# Patient Record
Sex: Male | Born: 1960 | Race: White | Hispanic: No | Smoking: Never smoker
Health system: Southern US, Community
[De-identification: ages and names within clinical notes are randomized; demographics above are authoritative.]

## PROBLEM LIST (undated history)

## (undated) DIAGNOSIS — F32A Depression, unspecified: Secondary | ICD-10-CM

## (undated) DIAGNOSIS — I519 Heart disease, unspecified: Secondary | ICD-10-CM

## (undated) DIAGNOSIS — G4730 Sleep apnea, unspecified: Secondary | ICD-10-CM

## (undated) DIAGNOSIS — F419 Anxiety disorder, unspecified: Secondary | ICD-10-CM

## (undated) DIAGNOSIS — R011 Cardiac murmur, unspecified: Secondary | ICD-10-CM

## (undated) DIAGNOSIS — K219 Gastro-esophageal reflux disease without esophagitis: Secondary | ICD-10-CM

## (undated) DIAGNOSIS — F329 Major depressive disorder, single episode, unspecified: Secondary | ICD-10-CM

## (undated) DIAGNOSIS — E119 Type 2 diabetes mellitus without complications: Secondary | ICD-10-CM

## (undated) DIAGNOSIS — I499 Cardiac arrhythmia, unspecified: Secondary | ICD-10-CM

## (undated) HISTORY — DX: Depression, unspecified: F32.A

## (undated) HISTORY — DX: Anxiety disorder, unspecified: F41.9

## (undated) HISTORY — DX: Cardiac arrhythmia, unspecified: I49.9

## (undated) HISTORY — DX: Heart disease, unspecified: I51.9

## (undated) HISTORY — PX: OTHER SURGICAL HISTORY: SHX169

## (undated) HISTORY — DX: Major depressive disorder, single episode, unspecified: F32.9

## (undated) HISTORY — DX: Cardiac murmur, unspecified: R01.1

## (undated) HISTORY — DX: Gastro-esophageal reflux disease without esophagitis: K21.9

## (undated) HISTORY — DX: Sleep apnea, unspecified: G47.30

## (undated) HISTORY — DX: Type 2 diabetes mellitus without complications: E11.9

---

## 2006-05-14 ENCOUNTER — Ambulatory Visit: Payer: Self-pay | Admitting: Unknown Physician Specialty

## 2006-06-06 ENCOUNTER — Ambulatory Visit: Payer: Self-pay | Admitting: Unknown Physician Specialty

## 2010-04-01 ENCOUNTER — Ambulatory Visit: Payer: Self-pay | Admitting: Unknown Physician Specialty

## 2010-04-05 ENCOUNTER — Ambulatory Visit: Payer: Self-pay | Admitting: Unknown Physician Specialty

## 2014-08-18 DIAGNOSIS — E109 Type 1 diabetes mellitus without complications: Secondary | ICD-10-CM | POA: Insufficient documentation

## 2015-06-04 DIAGNOSIS — R110 Nausea: Secondary | ICD-10-CM | POA: Insufficient documentation

## 2015-06-04 DIAGNOSIS — R058 Other specified cough: Secondary | ICD-10-CM | POA: Insufficient documentation

## 2015-06-04 DIAGNOSIS — R05 Cough: Secondary | ICD-10-CM | POA: Insufficient documentation

## 2015-09-07 ENCOUNTER — Ambulatory Visit: Payer: Self-pay | Admitting: Urology

## 2015-11-09 ENCOUNTER — Ambulatory Visit: Payer: Self-pay | Admitting: Urology

## 2015-11-23 ENCOUNTER — Encounter: Payer: Self-pay | Admitting: Urology

## 2015-11-23 ENCOUNTER — Ambulatory Visit (INDEPENDENT_AMBULATORY_CARE_PROVIDER_SITE_OTHER): Payer: BC Managed Care – PPO | Admitting: Urology

## 2015-11-23 VITALS — BP 132/77 | HR 67 | Ht 71.0 in | Wt 173.0 lb

## 2015-11-23 DIAGNOSIS — N5203 Combined arterial insufficiency and corporo-venous occlusive erectile dysfunction: Secondary | ICD-10-CM

## 2015-11-23 DIAGNOSIS — Z8739 Personal history of other diseases of the musculoskeletal system and connective tissue: Secondary | ICD-10-CM | POA: Insufficient documentation

## 2015-11-23 DIAGNOSIS — Z125 Encounter for screening for malignant neoplasm of prostate: Secondary | ICD-10-CM

## 2015-11-23 DIAGNOSIS — R350 Frequency of micturition: Secondary | ICD-10-CM | POA: Diagnosis not present

## 2015-11-23 DIAGNOSIS — B360 Pityriasis versicolor: Secondary | ICD-10-CM | POA: Insufficient documentation

## 2015-11-23 LAB — URINALYSIS, COMPLETE
Bilirubin, UA: NEGATIVE
Ketones, UA: NEGATIVE
Leukocytes, UA: NEGATIVE
NITRITE UA: NEGATIVE
Protein, UA: NEGATIVE
RBC, UA: NEGATIVE
Specific Gravity, UA: 1.015 (ref 1.005–1.030)
Urobilinogen, Ur: 0.2 mg/dL (ref 0.2–1.0)
pH, UA: 7 (ref 5.0–7.5)

## 2015-11-23 LAB — BLADDER SCAN AMB NON-IMAGING

## 2015-11-23 LAB — MICROSCOPIC EXAMINATION
BACTERIA UA: NONE SEEN
EPITHELIAL CELLS (NON RENAL): NONE SEEN /HPF (ref 0–10)
RBC MICROSCOPIC, UA: NONE SEEN /HPF (ref 0–?)
WBC UA: NONE SEEN /HPF (ref 0–?)

## 2015-11-23 MED ORDER — OXYBUTYNIN CHLORIDE ER 10 MG PO TB24: 10.0000 mg | ORAL_TABLET | Freq: Every day | ORAL | Status: DC

## 2015-11-23 MED ORDER — SILDENAFIL CITRATE 100 MG PO TABS
100.0000 mg | ORAL_TABLET | Freq: Every day | ORAL | Status: DC | PRN
Start: 2015-11-23 — End: 2019-06-16

## 2015-11-23 NOTE — Progress Notes (Signed)
11/23/2015 5:28 PM   Travis Bishop 05/04/1961 [id number]  Referring provider: Beatris Si Klein/ [place]  Chief Complaint  Patient presents with  . Nocturia    New Patient    HPI: 55 yo M with urinary frequency and nocturia x2 which is fairly new over the past few months.  No dysuria or gross hematuria.  He does start with a slow urinary stream which improves with bladder emptying.  He does feel that he is able to empty this bladder for the most part.  Sometimes, he will void a small amount ~30 min later after he empties.    He has a history of type I diabetes since age 40.  His A1c 8 but has been poorly controlled as high as 13 many years ago.  No recent PSAs/ DRE.  No family history of prostate cancer.  His father did have bladder cancer.    He does complain of ED with mainating erections.  No ejaculatory dysfunction or orgasm.  This has been happening for the past 1.5 years.  He has tried Viagra for this problem in the past which as worked.  No contraindications.           IPSS      11/23/15 1400       International Prostate Symptom Score   How often have you had the sensation of not emptying your bladder? Less than half the time     How often have you had to urinate less than every two hours? More than half the time     How often have you found you stopped and started again several times when you urinated? Less than half the time     How often have you found it difficult to postpone urination? Less than half the time     How often have you had a weak urinary stream? Almost always     How often have you had to strain to start urination? More than half the time     How many times did you typically get up at night to urinate? 2 Times     Total IPSS Score 21     Quality of Life due to urinary symptoms   If you were to spend the rest of your life with your urinary condition just the way it is now how would you feel about that? Mostly Disatisfied        Score:  1-7 Mild 8-19  Moderate 20-35 Severe    PMH: Past Medical History  Diagnosis Date  . GERD (gastroesophageal reflux disease)   . Anxiety   . Arrhythmia   . Asthma   . Depression   . Diabetes ([place])   . Heart disease   . Heart murmur   . Sleep apnea     Surgical History: Past Surgical History  Procedure Laterality Date  . None      Home Medications:    Medication List       This list is accurate as of: 11/23/15 11:59 PM.  Always use your most recent med list.               busPIRone 15 MG tablet  Commonly known as:  BUSPAR     NOVOLOG 100 UNIT/ML injection  Generic drug:  insulin aspart  U UTD VIA INSULIN PUMP.  DO NOT EXCEED 100 UNITS PER DAY.     oxybutynin 10 MG 24 hr tablet  Commonly known as:  DITROPAN XL  Take 1  tablet (10 mg total) by mouth at bedtime.     sertraline 50 MG tablet  Commonly known as:  ZOLOFT     sildenafil 100 MG tablet  Commonly known as:  VIAGRA  Take 1 tablet (100 mg total) by mouth daily as needed for erectile dysfunction.        Allergies: No Known Allergies  Family History: Family History  Problem Relation Age of Onset  . Bladder Cancer Father   . Prostate cancer Neg Hx   . Kidney cancer Neg Hx     Social History:  reports that he has never smoked. He does not have any smokeless tobacco history on file. He reports that he drinks alcohol. He reports that he does not use illicit drugs.  ROS: UROLOGY Frequent Urination?: Yes Hard to postpone urination?: No Burning/pain with urination?: No Get up at night to urinate?: Yes Leakage of urine?: Yes Urine stream starts and stops?: Yes Trouble starting stream?: Yes Do you have to strain to urinate?: Yes Blood in urine?: No Urinary tract infection?: No Sexually transmitted disease?: No Injury to kidneys or bladder?: No Painful intercourse?: No Weak stream?: Yes Erection problems?: Yes Penile pain?: No  Gastrointestinal Nausea?: No Vomiting?: No Indigestion/heartburn?:  Yes Diarrhea?: No Constipation?: No  Constitutional Fever: No Night sweats?: No Weight loss?: No Fatigue?: No  Skin Skin rash/lesions?: Yes Itching?: Yes  Eyes Blurred vision?: No Double vision?: No  Ears/Nose/Throat Sore throat?: No Sinus problems?: No  Hematologic/Lymphatic Swollen glands?: No Easy bruising?: No  Cardiovascular Leg swelling?: No Chest pain?: No  Respiratory Cough?: No Shortness of breath?: No  Endocrine Excessive thirst?: No  Musculoskeletal Back pain?: No Joint pain?: No  Neurological Headaches?: No Dizziness?: No  Psychologic Depression?: Yes Anxiety?: Yes  Physical Exam: BP 132/77 mmHg  Pulse 67  Ht '5\' 11"'$  (1.803 m)  Wt 173 lb (78.472 kg)  BMI 24.14 kg/m2  Constitutional:  Alert and oriented, No acute distress. HEENT: [place] AT, moist mucus membranes.  Trachea midline, no masses. Cardiovascular: No clubbing, cyanosis, or edema. Respiratory: Normal respiratory effort, no increased work of breathing. GI: Abdomen is soft, nontender, nondistended, no abdominal masses GU: No CVA tenderness. Circumcised phallus, patient urethral meatus, normal scrotum with descended testicles bilaterally, no masses. DRE: Normal sphincter tone, 30 cc prostate, no nodules, nontender Skin: No rashes, bruises or suspicious lesions. Lymph: No cervical or inguinal adenopathy. Neurologic: Grossly intact, no focal deficits, moving all 4 extremities. Psychiatric: Normal mood and affect.  Laboratory Data:   Basic Metabolic Panel (BMP) (10/08/7251 8:45 AM) Basic Metabolic Panel (BMP) (66/44/0347 8:45 AM)  Component Value Ref Range  Glucose 347 (H) 70 - 110 mg/dL  Sodium 137 136 - 145 mmol/L  Potassium 4.5 3.6 - 5.1 mmol/L  Chloride 99 97 - 109 mmol/L  Carbon Dioxide (CO2) 28.6 22.0 - 32.0 mmol/L  Calcium 9.7 8.7 - 10.3 mg/dL  Urea Nitrogen (BUN) 17 7 - 25 mg/dL  Creatinine 0.9 0.7 - 1.3 mg/dL  Glomerular Filtration Rate (eGFR), MDRD Estimate 87 >60  mL/min/1.73sq m  BUN/Crea Ratio 18.9 6.0 - 20.0  Anion Gap w/K 13.9 6.0 - 42.5   Basic Metabolic Panel (BMP) (95/63/8756 8:45 AM)  Specimen Performing Laboratory  Blood [facility] - LAB  [place], [place] [address]   Back to top of Lab Results   Hemoglobin A1C (07/31/2015 8:45 AM) Hemoglobin A1C (07/31/2015 8:45 AM)  Component Value Ref Range  Hemoglobin A1C 8.5 (H) 4.2 - 5.6 %  Urinalysis Results for orders placed or performed in visit on 11/23/15  Microscopic Examination  Result Value Ref Range   WBC, UA None seen 0 -  5 /hpf   RBC, UA None seen 0 -  2 /hpf   Epithelial Cells (non renal) None seen 0 - 10 /hpf   Bacteria, UA None seen None seen/Few  Urinalysis, Complete  Result Value Ref Range   Specific Gravity, UA 1.015 1.005 - 1.030   pH, UA 7.0 5.0 - 7.5   Color, UA Yellow Yellow   Appearance Ur Clear Clear   Leukocytes, UA Negative Negative   Protein, UA Negative Negative/Trace   Glucose, UA 3+ (A) Negative   Ketones, UA Negative Negative   RBC, UA Negative Negative   Bilirubin, UA Negative Negative   Urobilinogen, Ur 0.2 0.2 - 1.0 mg/dL   Nitrite, UA Negative Negative   Microscopic Examination See below:   PSA  Result Value Ref Range   Prostate Specific Ag, Serum 1.1 0.0 - 4.0 ng/mL  BLADDER SCAN AMB NON-IMAGING  Result Value Ref Range   Scan Result 35m      Pertinent Imaging: Results for orders placed or performed in visit on 11/23/15  Microscopic Examination  Result Value Ref Range   WBC, UA None seen 0 -  5 /hpf   RBC, UA None seen 0 -  2 /hpf   Epithelial Cells (non renal) None seen 0 - 10 /hpf   Bacteria, UA None seen None seen/Few  Urinalysis, Complete  Result Value Ref Range   Specific Gravity, UA 1.015 1.005 - 1.030   pH, UA 7.0 5.0 - 7.5   Color, UA Yellow Yellow   Appearance Ur Clear Clear   Leukocytes, UA Negative Negative   Protein, UA Negative Negative/Trace   Glucose, UA 3+ (A) Negative    Ketones, UA Negative Negative   RBC, UA Negative Negative   Bilirubin, UA Negative Negative   Urobilinogen, Ur 0.2 0.2 - 1.0 mg/dL   Nitrite, UA Negative Negative   Microscopic Examination See below:   PSA  Result Value Ref Range   Prostate Specific Ag, Serum 1.1 0.0 - 4.0 ng/mL  BLADDER SCAN AMB NON-IMAGING  Result Value Ref Range   Scan Result 227m    Assessment & Plan:    1. Urinary frequency Discussed the relationship between long-standing diabetes and overactive bladder. No evidence of incomplete bladder emptying with lobe postvoid residual today.  Small prostate on exam today without obstructive voiding symptoms. Discussed plan for use of anticholinergic to help with bladder overactivity. Common side effects of medication discussed including dry eyes, dry mouth, and constipation. Prescribed oxybutynin 10 mg XL today, will follow-up to discuss efficacy  - Urinalysis, Complete - BLADDER SCAN AMB NON-IMAGING  2. Prostate cancer screening Discussed AUA guidielines vs. USPTF for prostate cancer screening the the risk and benefits of both Shared decision making- elected prostate cancer screening PSA drawn today, rectal exam benign  3. ED Discussed Viagra which was prescribed today   Return in about 3 months (around 02/20/2016) for PVR, symptoms recheck.  AsHollice EspyMD  [facility] [address] [phone]

## 2015-11-24 LAB — PSA: PROSTATE SPECIFIC AG, SERUM: 1.1 ng/mL (ref 0.0–4.0)

## 2016-02-29 ENCOUNTER — Encounter: Payer: Self-pay | Admitting: Urology

## 2016-02-29 ENCOUNTER — Ambulatory Visit: Payer: BC Managed Care – PPO | Admitting: Urology

## 2016-04-23 ENCOUNTER — Ambulatory Visit: Payer: BC Managed Care – PPO | Admitting: Urology

## 2016-07-18 DIAGNOSIS — F411 Generalized anxiety disorder: Secondary | ICD-10-CM | POA: Insufficient documentation

## 2016-07-18 DIAGNOSIS — R1013 Epigastric pain: Secondary | ICD-10-CM | POA: Insufficient documentation

## 2016-12-17 DIAGNOSIS — K469 Unspecified abdominal hernia without obstruction or gangrene: Secondary | ICD-10-CM | POA: Insufficient documentation

## 2016-12-18 ENCOUNTER — Other Ambulatory Visit: Payer: Self-pay | Admitting: Internal Medicine

## 2016-12-18 DIAGNOSIS — K469 Unspecified abdominal hernia without obstruction or gangrene: Secondary | ICD-10-CM

## 2016-12-23 ENCOUNTER — Other Ambulatory Visit
Admission: RE | Admit: 2016-12-23 | Discharge: 2016-12-23 | Disposition: A | Discharge status: Home / Self Care | Payer: BC Managed Care – PPO | Source: Ambulatory Visit | Attending: Internal Medicine | Admitting: Internal Medicine

## 2016-12-23 ENCOUNTER — Ambulatory Visit
Admission: RE | Admit: 2016-12-23 | Discharge: 2016-12-23 | Disposition: A | Discharge status: Home / Self Care | Payer: BC Managed Care – PPO | Source: Ambulatory Visit | Attending: Internal Medicine | Admitting: Internal Medicine

## 2016-12-23 DIAGNOSIS — K469 Unspecified abdominal hernia without obstruction or gangrene: Secondary | ICD-10-CM | POA: Diagnosis present

## 2016-12-23 DIAGNOSIS — K439 Ventral hernia without obstruction or gangrene: Secondary | ICD-10-CM | POA: Diagnosis not present

## 2016-12-23 DIAGNOSIS — N329 Bladder disorder, unspecified: Secondary | ICD-10-CM | POA: Diagnosis not present

## 2016-12-23 LAB — BASIC METABOLIC PANEL
ANION GAP: 6 (ref 5–15)
BUN: 9 mg/dL (ref 6–20)
CO2: 29 mmol/L (ref 22–32)
Calcium: 9.1 mg/dL (ref 8.9–10.3)
Chloride: 101 mmol/L (ref 101–111)
Creatinine, Ser: 0.85 mg/dL (ref 0.61–1.24)
GFR calc Af Amer: >60 mL/min (ref >60)
GFR calc non Af Amer: >60 mL/min (ref >60)
GLUCOSE: 133 mg/dL — AB (ref 65–99)
POTASSIUM: 4.4 mmol/L (ref 3.5–5.1)
Sodium: 136 mmol/L (ref 135–145)

## 2016-12-23 MED ORDER — IOPAMIDOL (ISOVUE-300) INJECTION 61%
100.0000 mL | Freq: Once | INTRAVENOUS | Status: AC | PRN
  Administered 2016-12-23: 100 mL via INTRAVENOUS

## 2017-02-25 ENCOUNTER — Other Ambulatory Visit: Payer: Self-pay | Admitting: Student

## 2017-02-25 DIAGNOSIS — R110 Nausea: Secondary | ICD-10-CM

## 2017-02-25 DIAGNOSIS — K219 Gastro-esophageal reflux disease without esophagitis: Secondary | ICD-10-CM

## 2017-02-27 ENCOUNTER — Ambulatory Visit: Payer: BC Managed Care – PPO

## 2017-03-05 ENCOUNTER — Ambulatory Visit
Admission: RE | Admit: 2017-03-05 | Discharge: 2017-03-05 | Disposition: A | Discharge status: Home / Self Care | Payer: BC Managed Care – PPO | Source: Ambulatory Visit | Attending: Student | Admitting: Student

## 2017-03-05 DIAGNOSIS — R110 Nausea: Secondary | ICD-10-CM | POA: Diagnosis present

## 2017-03-05 DIAGNOSIS — K219 Gastro-esophageal reflux disease without esophagitis: Secondary | ICD-10-CM

## 2017-03-05 DIAGNOSIS — K30 Functional dyspepsia: Secondary | ICD-10-CM | POA: Diagnosis not present

## 2017-12-13 IMAGING — CT CT ABD-PELV W/ CM
2 of 5 series · 16 of 46 positions shown, 18 images · IV contrast (iopamidol)
Comparison: None.

CLINICAL DATA: Abdominal tenderness and swelling for 3 weeks

EXAM:
CT ABDOMEN AND PELVIS WITH CONTRAST
TECHNIQUE: Multidetector CT imaging of the abdomen and pelvis was performed
using the standard protocol following bolus administration of
intravenous contrast. Oral contrast was also administered.
CONTRAST:  100mL [id number] IOPAMIDOL ([id number]) INJECTION 61%

[Series 2: axial soft tissue · axial · 0.77mm/px · z∈[-986,-571]mm · 13 of 93 slices shown, 15 images]
[im 5/93  soft-tissue]
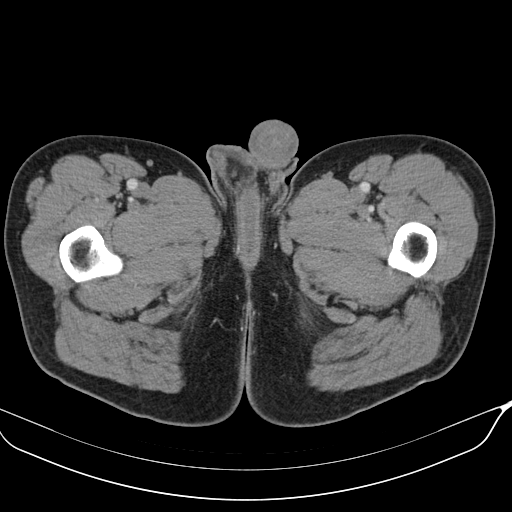
[im 5/93  bone]
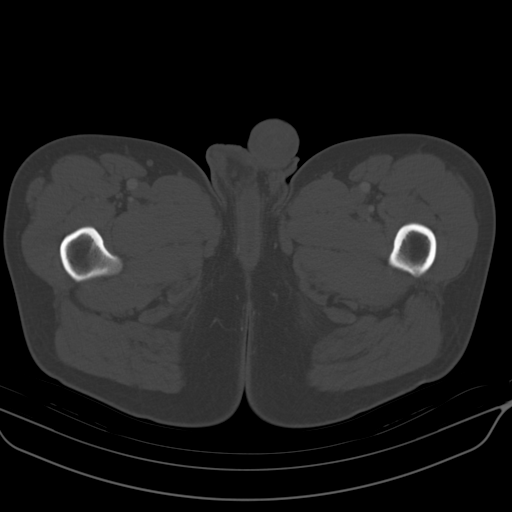
[im 15/93  soft-tissue]
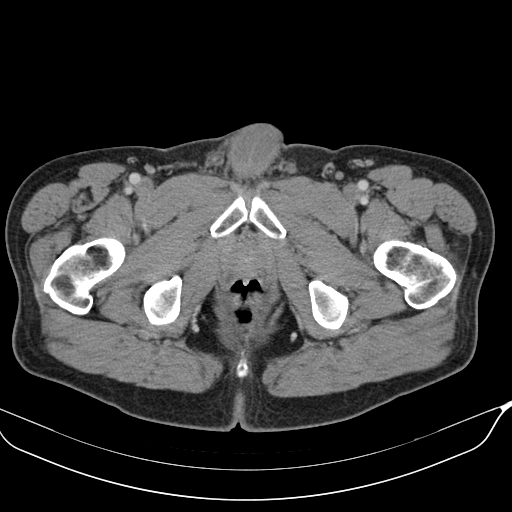
[im 20/93  soft-tissue]
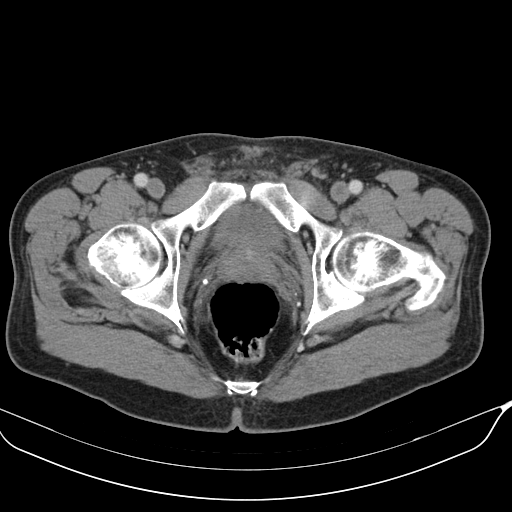
[im 25/93  soft-tissue]
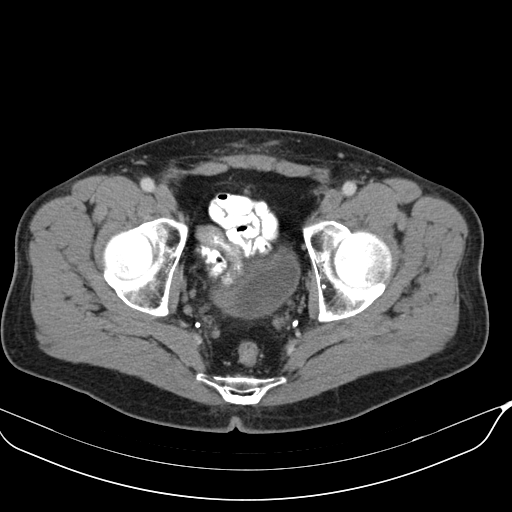
[im 34/93  soft-tissue]
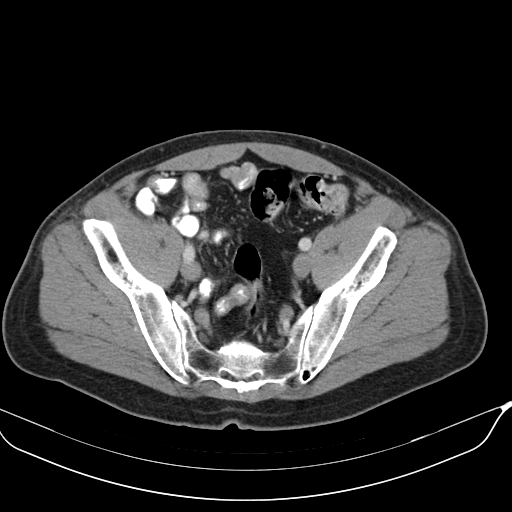
[im 39/93  soft-tissue]
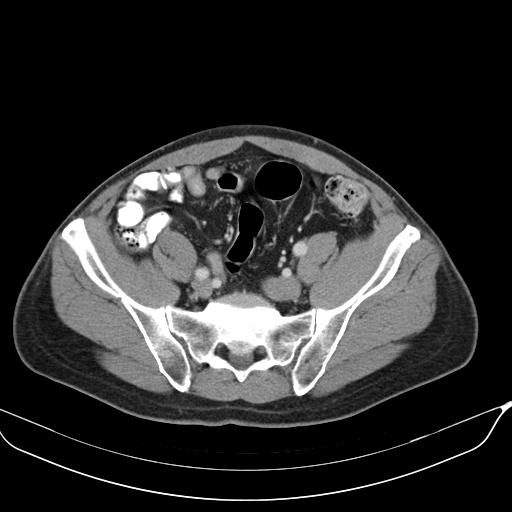
[im 49/93  soft-tissue]
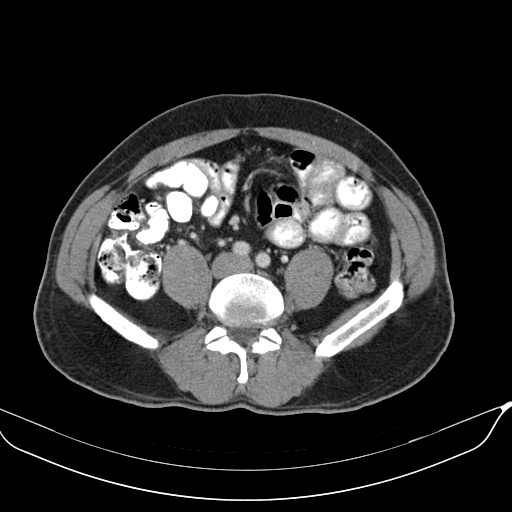
[im 54/93  soft-tissue]
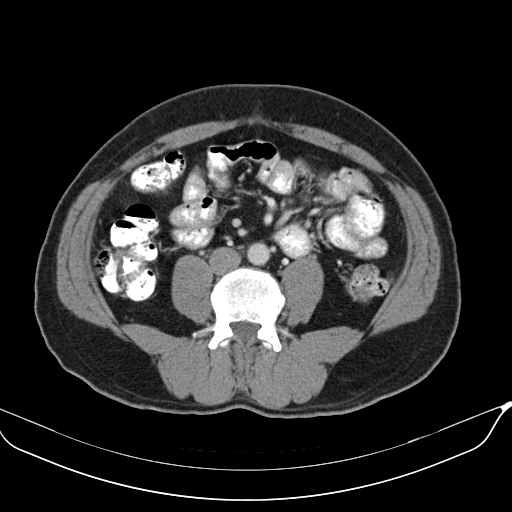
[im 59/93  soft-tissue]
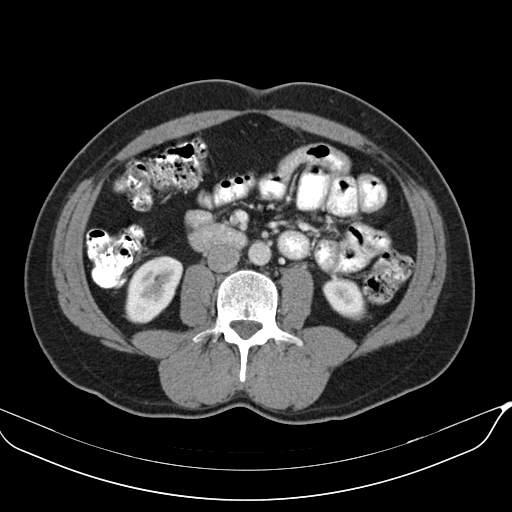
[im 59/93  bone]
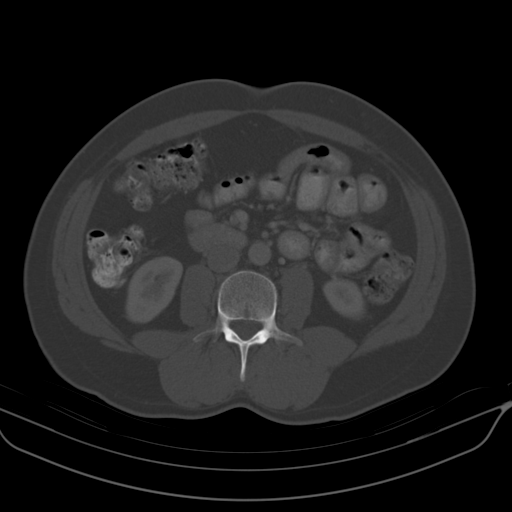
[im 68/93  soft-tissue]
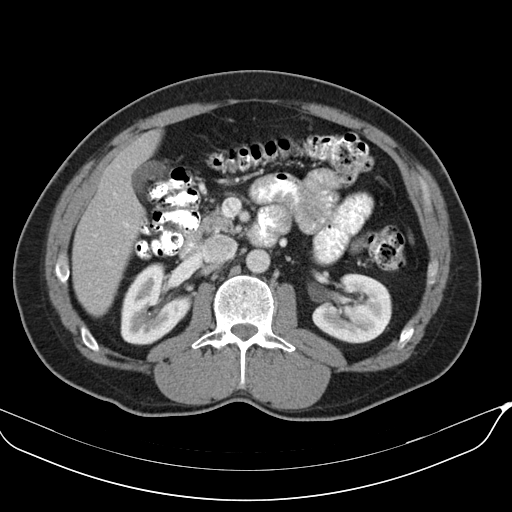
[im 73/93  soft-tissue]
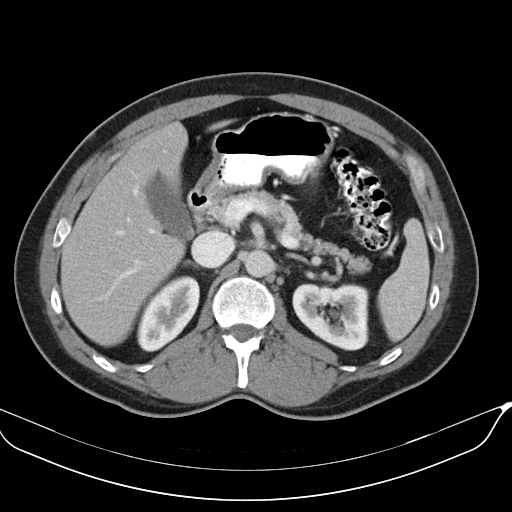
[im 78/93  soft-tissue]
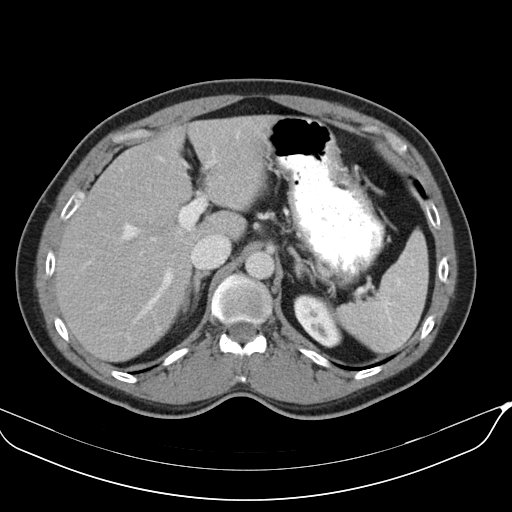
[im 88/93  soft-tissue]
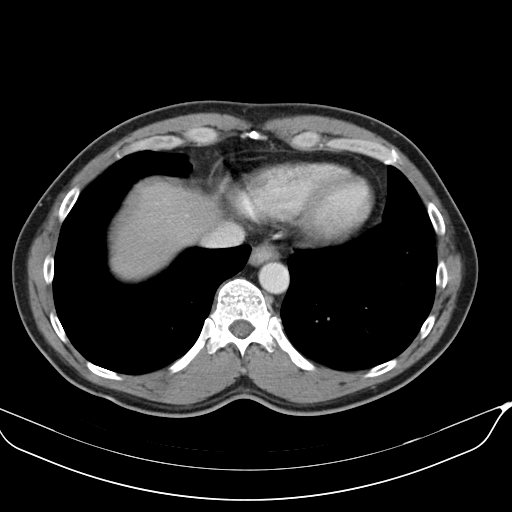

[Series 602: coronal · coronal · 0.90mm/px · 3 of 118 slices shown]
[im 40/118  soft-tissue]
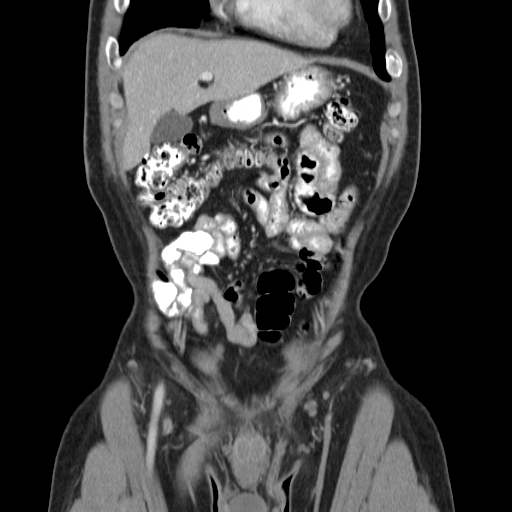
[im 53/118  soft-tissue]
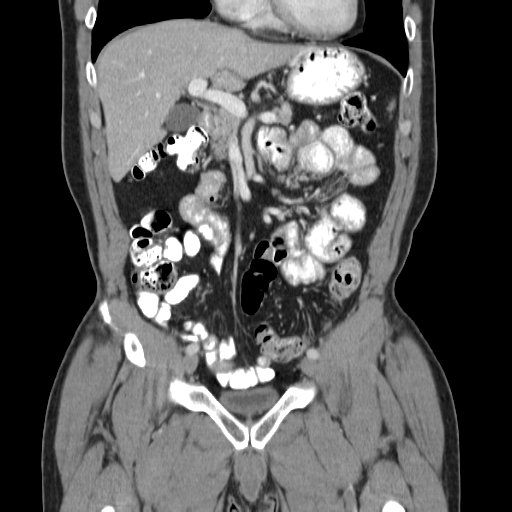
[im 66/118  soft-tissue]
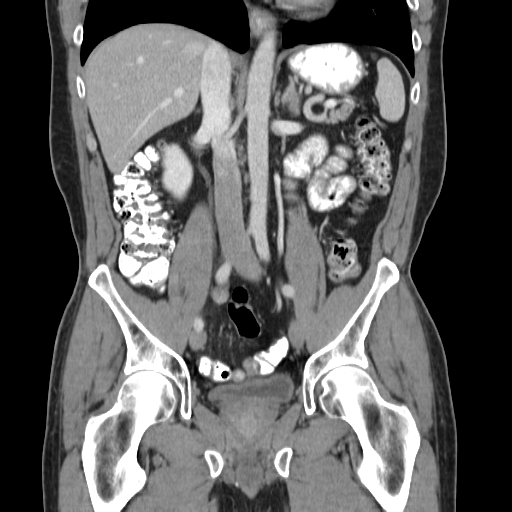

[16 of 46 positions shown; findings below may reference images not displayed]

FINDINGS: Lower chest: Lung bases are clear. Oral contrast is seen in the
distal esophagus.

Hepatobiliary: No focal liver lesions are evident. Gallbladder wall
is not appreciably thickened. There is no biliary duct dilatation.

Pancreas: There is no pancreatic mass or inflammatory focus.

Spleen: No splenic lesions are evident.

Adrenals/Urinary Tract: Adrenals appear normal bilaterally. Kidneys
bilaterally show no evidence of mass or hydronephrosis on either
side. There is no renal or ureteral calculus on either side. Urinary
bladder is midline with wall thickness slightly increased.

Stomach/Bowel: Rectum is mildly distended with air. No rectal wall
thickening. There are scattered colonic diverticula without
diverticulitis. There is no small or large bowel wall or mesenteric
thickening appreciable. There is apparent wall thickening focally in
the gastric antrum. No bowel obstruction. No free air or portal
venous air.

Vascular/Lymphatic: There is no abdominal aortic aneurysm. No
vascular lesions are evident. There is no adenopathy in the abdomen
or pelvis.

Reproductive: Prostate and seminal vesicles appear normal in size
and contour. No pelvic mass evident.

Other: Appendix appears unremarkable. There is no ascites or abscess
in the abdomen or pelvis. There is a minimal ventral hernia
containing only fat.

Musculoskeletal: There are no blastic or lytic bone lesions. A tiny
focus of nitrogen is noted in the posteromedial left iliac bone at
the level of the sacroiliac joint, most likely of arthropathic
etiology. There is no intramuscular or abdominal wall lesion.
IMPRESSION: Focal wall thickening in the gastric antrum region. Suspect a degree
of localized antral gastritis. No ulceration or fistula seen in this
area. No other bowel wall thickening. No bowel obstruction. No
abscess. Appendix appears unremarkable.

No renal or ureteral calculus. No hydronephrosis. Mild urinary
bladder wall thickening raises concern for a degree of cystitis.

Oral contrast in the distal esophagus is felt to be due to
spontaneous gastroesophageal reflux.

Rather minimal ventral hernia present containing only fat.

## 2019-06-07 ENCOUNTER — Other Ambulatory Visit: Payer: Self-pay

## 2019-06-07 ENCOUNTER — Encounter: Payer: Self-pay | Admitting: Urology

## 2019-06-07 ENCOUNTER — Other Ambulatory Visit
Admission: RE | Admit: 2019-06-07 | Discharge: 2019-06-07 | Disposition: A | Discharge status: Home / Self Care | Payer: BC Managed Care – PPO | Attending: Urology | Admitting: Urology

## 2019-06-07 ENCOUNTER — Ambulatory Visit: Payer: BC Managed Care – PPO | Admitting: Urology

## 2019-06-07 VITALS — BP 123/70 | HR 62 | Ht 72.0 in | Wt 178.0 lb

## 2019-06-07 DIAGNOSIS — N529 Male erectile dysfunction, unspecified: Secondary | ICD-10-CM | POA: Diagnosis not present

## 2019-06-07 DIAGNOSIS — N401 Enlarged prostate with lower urinary tract symptoms: Secondary | ICD-10-CM | POA: Diagnosis not present

## 2019-06-07 DIAGNOSIS — R350 Frequency of micturition: Secondary | ICD-10-CM

## 2019-06-07 DIAGNOSIS — N138 Other obstructive and reflux uropathy: Secondary | ICD-10-CM | POA: Diagnosis not present

## 2019-06-07 LAB — URINALYSIS, COMPLETE (UACMP) WITH MICROSCOPIC
Bacteria, UA: NONE SEEN
Bilirubin Urine: NEGATIVE
Glucose, UA: 500 mg/dL — AB
Ketones, ur: NEGATIVE mg/dL
Leukocytes,Ua: NEGATIVE
Nitrite: NEGATIVE
Protein, ur: NEGATIVE mg/dL
Specific Gravity, Urine: 1.02 (ref 1.005–1.030)
Squamous Epithelial / LPF: NONE SEEN (ref 0–5)
pH: 5.5 (ref 5.0–8.0)

## 2019-06-07 MED ORDER — TADALAFIL 20 MG PO TABS: 20.0000 mg | ORAL_TABLET | Freq: Every day | ORAL | 11 refills | Status: DC | PRN

## 2019-06-07 MED ORDER — TAMSULOSIN HCL 0.4 MG PO CAPS: 0.4000 mg | ORAL_CAPSULE | Freq: Every day | ORAL | 11 refills | Status: DC

## 2019-06-07 NOTE — Progress Notes (Signed)
06/07/19 3:45 PM   Travis Bishop Feb 16, 1961 [id number]  Referring provider: Adin Hector, MD [address],  [place] [address]  CC: BPH and urinary symptoms, erectile dysfunction  HPI: I saw Travis Bishop in urology clinic today for evaluation of the above from Dr. Caryl Comes.  He is a 58 year old male with poorly controlled type 1 diabetes with baseline hemoglobin A1c of 8-9 who reports a long history of urinary symptoms and erectile dysfunction.  He was previously seen by Dr. Erlene Quan in early 2017, however he never followed up.  Regarding his urinary symptoms, he reports weak stream and difficulty initiating stream during the day, as well as bothersome nocturia 2-3 times per night.  He drinks coffee, tea, and Gatorade during the day.  He has a diagnosis of sleep apnea in his chart, but he personally denies sleep apnea or use of the CPAP machine.  There is no history of urinary retention or UTIs.  He has not tried any medications for this recently.  In terms of his erectile dysfunction, he has a new girlfriend and is interested in becoming sexually active.  He has never taken medications for this either.  He has had some trouble getting and maintaining an erection in the past.  There are no aggravating or alleviating factors.  Severity is mild to moderate.   PMH: Past Medical History:  Diagnosis Date   Anxiety    Arrhythmia    Depression    Diabetes ([place])    GERD (gastroesophageal reflux disease)    Heart disease    Heart murmur    Sleep apnea     Surgical History: Past Surgical History:  Procedure Laterality Date   none      Allergies: No Known Allergies  Family History: Family History  Problem Relation Age of Onset   Bladder Cancer Father    Prostate cancer Neg Hx    Kidney cancer Neg Hx     Social History:  reports that he has never smoked. He has never used smokeless tobacco. He reports current alcohol use. He reports that  he does not use drugs.  ROS: Please see flowsheet from today's date for complete review of systems.  Physical Exam: BP 123/70 (BP Location: Left Arm, Patient Position: Sitting, Cuff Size: Normal)    Pulse 62    Ht 6' (1.829 m)    Wt 178 lb (80.7 kg)    BMI 24.14 kg/m    Constitutional:  Alert and oriented, No acute distress. Cardiovascular: No clubbing, cyanosis, or edema. Respiratory: Normal respiratory effort, no increased work of breathing. GI: Abdomen is soft, nontender, nondistended, no abdominal masses GU: No CVA tenderness Lymph: No cervical or inguinal lymphadenopathy. Skin: No rashes, bruises or suspicious lesions. Neurologic: Grossly intact, no focal deficits, moving all 4 extremities. Psychiatric: Normal mood and affect.  Laboratory Data: Urinalysis benign today with 0 RBCs, 0 WBCs, no bacteria  Pertinent Imaging: None to review  Assessment & Plan:   In summary, the patient is a 58 year old male with poorly controlled type 1 diabetes and urinary symptoms of weak stream and nocturia as well as erectile dysfunction.  We discussed the role of his diabetes and both of these issues, including glucosuria and nerve damage to the bladder and nerves for erections.  I recommended a trial of Flomax for his urinary symptoms of weak stream and nocturia, and a trial of tadalafil 20 mg on demand for his erectile dysfunction.  I stressed  at length the side effects of these medication and the importance of not taking these together to prevent risk of dizziness or hypotension.  We also discussed behavioral strategies for his urinary symptoms including minimizing coffee and tea in the diet, minimizing fluids before bed, and double voiding prior to bed.  RTC 1 year for symptom check, sooner if problems   Sondra ComeBrian C Noriah Osgood, MD  [facility] [address], [address] [place], [place] [address] [phone]

## 2019-06-07 NOTE — Patient Instructions (Signed)
Tadalafil tablets (Cialis) What is this medicine? TADALAFIL (tah DA la fil) is used to treat erection problems in men. It is also used for enlargement of the prostate gland in men, a condition called benign prostatic hyperplasia or BPH. This medicine improves urine flow and reduces BPH symptoms. This medicine can also treat both erection problems and BPH when they occur together. This medicine may be used for other purposes; ask your health care provider or pharmacist if you have questions. COMMON BRAND NAME(S): Adcirca, ALYQ, Cialis What should I tell my health care provider before I take this medicine? They need to know if you have any of these conditions:  bleeding disorders  eye or vision problems, including a rare inherited eye disease called retinitis pigmentosa  anatomical deformation of the penis, Peyronie's disease, or history of priapism (painful and prolonged erection)  heart disease, angina, a history of heart attack, irregular heart beats, or other heart problems  high or low blood pressure  history of blood diseases, like sickle cell anemia or leukemia  history of stomach bleeding  kidney disease  liver disease  stroke  an unusual or allergic reaction to tadalafil, other medicines, foods, dyes, or preservatives  pregnant or trying to get pregnant  breast-feeding How should I use this medicine? Take this medicine by mouth with a glass of water. Follow the directions on the prescription label. You may take this medicine with or without meals. When this medicine is used for erection problems, your doctor may prescribe it to be taken once daily or as needed. If you are taking the medicine as needed, you may be able to have sexual activity 30 minutes after taking it and for up to 36 hours after taking it. Whether you are taking the medicine as needed or once daily, you should not take more than one dose per day. If you are taking this medicine for symptoms of benign  prostatic hyperplasia (BPH) or to treat both BPH and an erection problem, take the dose once daily at about the same time each day. Do not take your medicine more often than directed. Talk to your pediatrician regarding the use of this medicine in children. Special care may be needed. Overdosage: If you think you have taken too much of this medicine contact a poison control center or emergency room at once. NOTE: This medicine is only for you. Do not share this medicine with others. What if I miss a dose? If you are taking this medicine as needed for erection problems, this does not apply. If you miss a dose while taking this medicine once daily for an erection problem, benign prostatic hyperplasia, or both, take it as soon as you remember, but do not take more than one dose per day. What may interact with this medicine? Do not take this medicine with any of the following medications:  nitrates like amyl nitrite, isosorbide dinitrate, isosorbide mononitrate, nitroglycerin  other medicines for erectile dysfunction like avanafil, sildenafil, vardenafil  other tadalafil products (Adcirca)  riociguat This medicine may also interact with the following medications:  certain drugs for high blood pressure  certain drugs for the treatment of HIV infection or AIDS  certain drugs used for fungal or yeast infections, like fluconazole, itraconazole, ketoconazole, and voriconazole  certain drugs used for seizures like carbamazepine, phenytoin, and phenobarbital  grapefruit juice  macrolide antibiotics like clarithromycin, erythromycin, troleandomycin  medicines for prostate problems  rifabutin, rifampin or rifapentine This list may not describe all possible interactions. Give your   health care provider a list of all the medicines, herbs, non-prescription drugs, or dietary supplements you use. Also tell them if you smoke, drink alcohol, or use illegal drugs. Some items may interact with your  medicine. What should I watch for while using this medicine? If you notice any changes in your vision while taking this drug, call your doctor or health care professional as soon as possible. Stop using this medicine and call your health care provider right away if you have a loss of sight in one or both eyes. Contact your doctor or health care professional right away if the erection lasts longer than 4 hours or if it becomes painful. This may be a sign of serious problem and must be treated right away to prevent permanent damage. If you experience symptoms of nausea, dizziness, chest pain or arm pain upon initiation of sexual activity after taking this medicine, you should refrain from further activity and call your doctor or health care professional as soon as possible. Do not drink alcohol to excess (examples, 5 glasses of wine or 5 shots of whiskey) when taking this medicine. When taken in excess, alcohol can increase your chances of getting a headache or getting dizzy, increasing your heart rate or lowering your blood pressure. Using this medicine does not protect you or your partner against HIV infection (the virus that causes AIDS) or other sexually transmitted diseases. What side effects may I notice from receiving this medicine? Side effects that you should report to your doctor or health care professional as soon as possible:  allergic reactions like skin rash, itching or hives, swelling of the face, lips, or tongue  breathing problems  changes in hearing  changes in vision  chest pain  fast, irregular heartbeat  prolonged or painful erection  seizures Side effects that usually do not require medical attention (report to your doctor or health care professional if they continue or are bothersome):  back pain  dizziness  flushing  headache  indigestion  muscle aches  nausea  stuffy or runny nose This list may not describe all possible side effects. Call your doctor  for medical advice about side effects. You may report side effects to FDA at 1-800-FDA-1088. Where should I keep my medicine? Keep out of the reach of children. Store at room temperature between 15 and 30 degrees C (59 and 86 degrees F). Throw away any unused medicine after the expiration date. NOTE: This sheet is a summary. It may not cover all possible information. If you have questions about this medicine, talk to your doctor, pharmacist, or health care provider.  2020 Elsevier/Gold Standard (2014-02-10 13:15:49)  Tamsulosin capsules What is this medicine? TAMSULOSIN (tam SOO loe sin) is an alpha blocker. It is used to treat the signs and symptoms of an enlarged prostate in men. This condition is also called benign prostatic hyperplasia (BPH). This medicine may be used for other purposes; ask your health care provider or pharmacist if you have questions. COMMON BRAND NAME(S): Flomax What should I tell my health care provider before I take this medicine? They need to know if you have any of the following conditions:  advanced kidney disease  advanced liver disease  low blood pressure  prostate cancer  an unusual or allergic reaction to tamsulosin, sulfa drugs, other medicines, foods, dyes, or preservatives  pregnant or trying to get pregnant  breast-feeding How should I use this medicine? Take this medicine by mouth about 30 minutes after the same meal every day.  Follow the directions on the prescription label. Swallow the capsules whole with a glass of water. Do not crush, chew, or open capsules. Do not take your medicine more often than directed. Do not stop taking your medicine unless your doctor tells you to. Talk to your pediatrician regarding the use of this medicine in children. Special care may be needed. Overdosage: If you think you have taken too much of this medicine contact a poison control center or emergency room at once. NOTE: This medicine is only for you. Do not  share this medicine with others. What if I miss a dose? If you miss a dose, take it as soon as you can. If it is almost time for your next dose, take only that dose. Do not take double or extra doses. If you stop taking your medicine for several days or more, ask your doctor or health care professional what dose you should start back on. What may interact with this medicine?  cimetidine  fluoxetine  ketoconazole  medicines for erectile disfunction like sildenafil, tadalafil, vardenafil  medicines for high blood pressure  other alpha-blockers like alfuzosin, doxazosin, phentolamine, phenoxybenzamine, prazosin, terazosin  warfarin This list may not describe all possible interactions. Give your health care provider a list of all the medicines, herbs, non-prescription drugs, or dietary supplements you use. Also tell them if you smoke, drink alcohol, or use illegal drugs. Some items may interact with your medicine. What should I watch for while using this medicine? Visit your doctor or health care professional for regular check ups. You will need lab work done before you start this medicine and regularly while you are taking it. Check your blood pressure as directed. Ask your health care professional what your blood pressure should be, and when you should contact him or her. This medicine may make you feel dizzy or lightheaded. This is more likely to happen after the first dose, after an increase in dose, or during hot weather or exercise. Drinking alcohol and taking some medicines can make this worse. Do not drive, use machinery, or do anything that needs mental alertness until you know how this medicine affects you. Do not sit or stand up quickly. If you begin to feel dizzy, sit down until you feel better. These effects can decrease once your body adjusts to the medicine. Contact your doctor or health care professional right away if you have an erection that lasts longer than 4 hours or if it  becomes painful. This may be a sign of a serious problem and must be treated right away to prevent permanent damage. If you are thinking of having cataract surgery, tell your eye surgeon that you have taken this medicine. What side effects may I notice from receiving this medicine? Side effects that you should report to your doctor or health care professional as soon as possible:  allergic reactions like skin rash or itching, hives, swelling of the lips, mouth, tongue, or throat  breathing problems  change in vision  feeling faint or lightheaded  irregular heartbeat  prolonged or painful erection  weakness Side effects that usually do not require medical attention (report to your doctor or health care professional if they continue or are bothersome):  back pain  change in sex drive or performance  constipation, nausea or vomiting  cough  drowsy  runny or stuffy nose  trouble sleeping This list may not describe all possible side effects. Call your doctor for medical advice about side effects. You may report side  effects to FDA at 1-800-FDA-1088. Where should I keep my medicine? Keep out of the reach of children. Store at room temperature between 15 and 30 degrees C (59 and 86 degrees F). Throw away any unused medicine after the expiration date. NOTE: This sheet is a summary. It may not cover all possible information. If you have questions about this medicine, talk to your doctor, pharmacist, or health care provider.  2020 Elsevier/Gold Standard (2018-02-25 12:54:06)

## 2019-06-14 ENCOUNTER — Telehealth: Payer: Self-pay | Admitting: Urology

## 2019-06-14 NOTE — Telephone Encounter (Signed)
Pt called and states that the Tadalafil is not working to his satisfaction and would like to try something else.

## 2019-06-14 NOTE — Telephone Encounter (Signed)
Can something else be called in or should he have an appointment to discuss?

## 2019-06-15 NOTE — Telephone Encounter (Signed)
He can try revatio 100mg  on demand, remind him about goodrx[employer]com coupon, thanks  Nickolas Madrid, MD 06/15/2019

## 2019-06-16 MED ORDER — SILDENAFIL CITRATE 100 MG PO TABS: 100.0000 mg | ORAL_TABLET | Freq: Every day | ORAL | 5 refills | Status: DC | PRN

## 2019-06-16 NOTE — Telephone Encounter (Signed)
Patient notified

## 2019-06-17 ENCOUNTER — Telehealth: Payer: Self-pay | Admitting: Urology

## 2019-06-17 MED ORDER — SILDENAFIL CITRATE 100 MG PO TABS: 100.0000 mg | ORAL_TABLET | Freq: Every day | ORAL | 5 refills | Status: DC | PRN

## 2019-06-17 NOTE — Telephone Encounter (Signed)
Pt would like to have Sildenafil sent to [facility] in [place] instead of [place].

## 2019-06-17 NOTE — Telephone Encounter (Signed)
Rx sent in patient notified 

## 2019-06-27 ENCOUNTER — Telehealth: Payer: Self-pay | Admitting: Urology

## 2019-06-27 NOTE — Telephone Encounter (Signed)
Spoke with patient-he has been taking sildenafil daily as needed. He wants to know if he should stick with sildenafil or try Cialis for a longer period? He did try Cialis the six tablets as prescribed with no improvement. He said both have not been working well. Clarified with patient he has not been taking both at the same time.

## 2019-06-27 NOTE — Telephone Encounter (Signed)
Pt called stating that he has tried 2 different medications and neither have worked for him. Pt asking is there anything else he can try. Pt did not want to elborate.  Please advise at [phone].

## 2019-06-28 ENCOUNTER — Telehealth: Payer: Self-pay | Admitting: Urology

## 2019-06-28 NOTE — Telephone Encounter (Signed)
There are no other oral options for ED, other options would include vacuum device or penile injections. Please offer him a visit with [facility] to discuss injections further if he desires. Please confirm he was taking the max doses of sildenafil(100mg ) and tadalafil(20mg ), and taking them at least an hour before sexual activity.   Thanks Nickolas Madrid, MD 06/28/2019

## 2019-06-28 NOTE — Telephone Encounter (Signed)
Patient called back at 4:57 and left a voicemail returning a call    Michelle

## 2019-06-28 NOTE — Telephone Encounter (Signed)
Called pt no answer. Left detailed message for pt informing him of the information below. Advised pt to ensure he is taking max dose. Also advised pt to call office to set up appt to discuss other options if max dose is not working well.

## 2019-06-29 NOTE — Telephone Encounter (Signed)
Detailed message was left on Voicemail before this call back.

## 2020-05-15 ENCOUNTER — Other Ambulatory Visit
Admission: RE | Admit: 2020-05-15 | Discharge: 2020-05-15 | Disposition: A | Discharge status: Home / Self Care | Payer: BC Managed Care – PPO | Source: Ambulatory Visit | Attending: Cardiology | Admitting: Cardiology

## 2020-05-15 ENCOUNTER — Other Ambulatory Visit: Payer: Self-pay

## 2020-05-15 DIAGNOSIS — Z01812 Encounter for preprocedural laboratory examination: Secondary | ICD-10-CM | POA: Diagnosis not present

## 2020-05-15 DIAGNOSIS — Z20822 Contact with and (suspected) exposure to covid-19: Secondary | ICD-10-CM | POA: Insufficient documentation

## 2020-05-15 LAB — SARS CORONAVIRUS 2 (TAT 6-24 HRS): SARS Coronavirus 2: NEGATIVE

## 2020-05-17 ENCOUNTER — Ambulatory Visit
Admission: RE | Admit: 2020-05-17 | Discharge: 2020-05-17 | Disposition: A | Discharge status: Home / Self Care | Payer: BC Managed Care – PPO | Attending: Cardiology | Admitting: Cardiology

## 2020-05-17 ENCOUNTER — Encounter: Payer: Self-pay | Admitting: Cardiology

## 2020-05-17 ENCOUNTER — Encounter
Admission: RE | Disposition: A | Discharge status: Home / Self Care | Payer: Self-pay | Source: Home / Self Care | Attending: Cardiology

## 2020-05-17 ENCOUNTER — Other Ambulatory Visit: Payer: Self-pay

## 2020-05-17 DIAGNOSIS — R079 Chest pain, unspecified: Secondary | ICD-10-CM | POA: Diagnosis not present

## 2020-05-17 DIAGNOSIS — R9439 Abnormal result of other cardiovascular function study: Secondary | ICD-10-CM

## 2020-05-17 DIAGNOSIS — Z79899 Other long term (current) drug therapy: Secondary | ICD-10-CM | POA: Diagnosis not present

## 2020-05-17 DIAGNOSIS — Z9641 Presence of insulin pump (external) (internal): Secondary | ICD-10-CM | POA: Diagnosis not present

## 2020-05-17 DIAGNOSIS — Z794 Long term (current) use of insulin: Secondary | ICD-10-CM | POA: Diagnosis not present

## 2020-05-17 DIAGNOSIS — E1065 Type 1 diabetes mellitus with hyperglycemia: Secondary | ICD-10-CM | POA: Diagnosis not present

## 2020-05-17 DIAGNOSIS — I2510 Atherosclerotic heart disease of native coronary artery without angina pectoris: Secondary | ICD-10-CM | POA: Insufficient documentation

## 2020-05-17 DIAGNOSIS — E785 Hyperlipidemia, unspecified: Secondary | ICD-10-CM | POA: Insufficient documentation

## 2020-05-17 DIAGNOSIS — R0602 Shortness of breath: Secondary | ICD-10-CM | POA: Insufficient documentation

## 2020-05-17 HISTORY — PX: LEFT HEART CATH AND CORONARY ANGIOGRAPHY: CATH118249

## 2020-05-17 SURGERY — LEFT HEART CATH AND CORONARY ANGIOGRAPHY
Anesthesia: Moderate Sedation | Laterality: Left

## 2020-05-17 MED ORDER — HEPARIN (PORCINE) IN NACL 1000-0.9 UT/500ML-% IV SOLN
INTRAVENOUS | Status: DC | PRN
  Administered 2020-05-17: 500 mL

## 2020-05-17 MED ORDER — SODIUM CHLORIDE 0.9% FLUSH: 3.0000 mL | INTRAVENOUS | Status: DC | PRN

## 2020-05-17 MED ORDER — HEPARIN (PORCINE) IN NACL 1000-0.9 UT/500ML-% IV SOLN
INTRAVENOUS | Status: AC
  Filled 2020-05-17: qty 1000

## 2020-05-17 MED ORDER — IOHEXOL 300 MG/ML  SOLN
INTRAMUSCULAR | Status: DC | PRN
  Administered 2020-05-17: 50 mL

## 2020-05-17 MED ORDER — ASPIRIN 81 MG PO CHEW
81.0000 mg | CHEWABLE_TABLET | ORAL | Status: AC
  Administered 2020-05-17: 81 mg via ORAL

## 2020-05-17 MED ORDER — SODIUM CHLORIDE 0.9 % WEIGHT BASED INFUSION: 1.0000 mL/kg/h | INTRAVENOUS | Status: DC

## 2020-05-17 MED ORDER — LIDOCAINE HCL (PF) 1 % IJ SOLN
INTRAMUSCULAR | Status: AC
  Filled 2020-05-17: qty 30

## 2020-05-17 MED ORDER — SODIUM CHLORIDE 0.9 % WEIGHT BASED INFUSION
3.0000 mL/kg/h | INTRAVENOUS | Status: AC
  Administered 2020-05-17: 3 mL/kg/h via INTRAVENOUS

## 2020-05-17 MED ORDER — LIDOCAINE HCL (PF) 1 % IJ SOLN
INTRAMUSCULAR | Status: DC | PRN
  Administered 2020-05-17: 3 mL

## 2020-05-17 MED ORDER — VERAPAMIL HCL 2.5 MG/ML IV SOLN
INTRAVENOUS | Status: DC | PRN
  Administered 2020-05-17: 2.5 mg via INTRA_ARTERIAL

## 2020-05-17 MED ORDER — SODIUM CHLORIDE 0.9 % IV SOLN: 250.0000 mL | INTRAVENOUS | Status: DC | PRN

## 2020-05-17 MED ORDER — FENTANYL CITRATE (PF) 100 MCG/2ML IJ SOLN
INTRAMUSCULAR | Status: AC
  Filled 2020-05-17: qty 2

## 2020-05-17 MED ORDER — HYDRALAZINE HCL 20 MG/ML IJ SOLN: 10.0000 mg | INTRAMUSCULAR | Status: DC | PRN

## 2020-05-17 MED ORDER — SODIUM CHLORIDE 0.9% FLUSH: 3.0000 mL | Freq: Two times a day (BID) | INTRAVENOUS | Status: DC

## 2020-05-17 MED ORDER — ASPIRIN 81 MG PO CHEW
CHEWABLE_TABLET | ORAL | Status: AC
  Filled 2020-05-17: qty 1

## 2020-05-17 MED ORDER — FENTANYL CITRATE (PF) 100 MCG/2ML IJ SOLN
INTRAMUSCULAR | Status: DC | PRN
  Administered 2020-05-17: 50 ug via INTRAVENOUS

## 2020-05-17 MED ORDER — HEPARIN SODIUM (PORCINE) 1000 UNIT/ML IJ SOLN
INTRAMUSCULAR | Status: DC | PRN
  Administered 2020-05-17: 4000 [IU] via INTRAVENOUS

## 2020-05-17 MED ORDER — MIDAZOLAM HCL 2 MG/2ML IJ SOLN
INTRAMUSCULAR | Status: DC | PRN
  Administered 2020-05-17: 1 mg via INTRAVENOUS

## 2020-05-17 MED ORDER — HEPARIN SODIUM (PORCINE) 1000 UNIT/ML IJ SOLN
INTRAMUSCULAR | Status: AC
  Filled 2020-05-17: qty 1

## 2020-05-17 MED ORDER — ACETAMINOPHEN 325 MG PO TABS: 650.0000 mg | ORAL_TABLET | ORAL | Status: DC | PRN

## 2020-05-17 MED ORDER — MIDAZOLAM HCL 2 MG/2ML IJ SOLN
INTRAMUSCULAR | Status: AC
  Filled 2020-05-17: qty 2

## 2020-05-17 MED ORDER — LABETALOL HCL 5 MG/ML IV SOLN: 10.0000 mg | INTRAVENOUS | Status: DC | PRN

## 2020-05-17 MED ORDER — ONDANSETRON HCL 4 MG/2ML IJ SOLN: 4.0000 mg | Freq: Four times a day (QID) | INTRAMUSCULAR | Status: DC | PRN

## 2020-05-17 MED ORDER — VERAPAMIL HCL 2.5 MG/ML IV SOLN
INTRAVENOUS | Status: AC
  Filled 2020-05-17: qty 2

## 2020-05-17 SURGICAL SUPPLY — 7 items
CATH 5F 110X4 TIG (CATHETERS) ×3 IMPLANT
DEVICE RAD TR BAND REGULAR (VASCULAR PRODUCTS) ×3 IMPLANT
GLIDESHEATH SLEND SS 6F .021 (SHEATH) ×3 IMPLANT
GUIDEWIRE INQWIRE 1.5J.035X260 (WIRE) ×1 IMPLANT
INQWIRE 1.5J .035X260CM (WIRE) ×3
KIT MANI 3VAL PERCEP (MISCELLANEOUS) ×3 IMPLANT
PACK CARDIAC CATH (CUSTOM PROCEDURE TRAY) ×3 IMPLANT

## 2020-05-17 NOTE — Discharge Instructions (Signed)
Radial Site Care Refer to this sheet in the next few weeks. These instructions provide you with information about caring for yourself after your procedure. Your health care provider may also give you more specific instructions. Your treatment has been planned according to current medical practices, but problems sometimes occur. Call your health care provider if you have any problems or questions after your procedure. What can I expect after the procedure? After your procedure, it is typical to have the following:  Bruising at the radial site that usually fades within 1-2 weeks.  Blood collecting in the tissue (hematoma) that may be painful to the touch. It should usually decrease in size and tenderness within 1-2 weeks.  Follow these instructions at home:  Take medicines only as directed by your health care provider. If you are on a medication called Metformin please do not take for 48 hours after your procedure.  Over the next 48hrs please increase your fluid intake of water and non caffeine beverages to flush the contrast dye out of your system.   You may shower 24 hours after the procedure  Leave your bandage on and gently wash the site with plain soap and water. Pat the area dry with a clean towel. Do not rub the site, because this may cause bleeding.  Remove your dressing 48hrs after your procedure and leave open to air.   Do not submerge your site in water for 7 days. This includes swimming and washing dishes.   Check your insertion site every day for redness, swelling, or drainage.  Do not apply powder or lotion to the site.  Do not flex or bend the affected arm for 24 hours or as directed by your health care provider.  Do not push or pull heavy objects with the affected arm for 24 hours or as directed by your health care provider.  Do not lift over 10 lb (4.5 kg) for 5 days after your procedure or as directed by your health care provider.  Ask your health care provider when it is  okay to: ? Return to work or school. ? Resume usual physical activities or sports. ? Resume sexual activity.  Do not drive home if you are discharged the same day as the procedure. Have someone else drive you.  You may drive 48 hours after the procedure Do not operate machinery or power tools for 24 hours after the procedure.  If your procedure was done as an outpatient procedure, which means that you went home the same day as your procedure, a responsible adult should be with you for the first 24 hours after you arrive home.  Keep all follow-up visits as directed by your health care provider. This is important. Contact a health care provider if:  You have a fever.  You have chills.  You have increased bleeding from the radial site. Hold pressure on the site. Get help right away if:  You have unusual pain at the radial site.  You have redness, warmth, or swelling at the radial site.  You have drainage (other than a small amount of blood on the dressing) from the radial site.  The radial site is bleeding, and the bleeding does not stop after 15 minutes of holding steady pressure on the site.  Your arm or hand becomes pale, cool, tingly, or numb. This information is not intended to replace advice given to you by your health care provider. Make sure you discuss any questions you have with your health care provider.   Document Released: 10/25/2010 Document Revised: 02/28/2016 Document Reviewed: 04/10/2014 Elsevier Interactive Patient Education  2018 Elsevier Inc.Angiogram, Care After This sheet gives you information about how to care for yourself after your procedure. Your health care provider may also give you more specific instructions. If you have problems or questions, contact your health care provider. What can I expect after the procedure? After the procedure, it is common to have bruising and tenderness at the catheter insertion area. Follow these instructions at  home: Insertion site care  Follow instructions from your health care provider about how to take care of your insertion site. Make sure you: ? Wash your hands with soap and water before you change your bandage (dressing). If soap and water are not available, use hand sanitizer. ? Change your dressing as told by your health care provider. ? Leave stitches (sutures), skin glue, or adhesive strips in place. These skin closures may need to stay in place for 2 weeks or longer. If adhesive strip edges start to loosen and curl up, you may trim the loose edges. Do not remove adhesive strips completely unless your health care provider tells you to do that.  Do not take baths, swim, or use a hot tub until your health care provider approves.  You may shower 24-48 hours after the procedure or as told by your health care provider. ? Gently wash the site with plain soap and water. ? Pat the area dry with a clean towel. ? Do not rub the site. This may cause bleeding.  Do not apply powder or lotion to the site. Keep the site clean and dry.  Check your insertion site every day for signs of infection. Check for: ? Redness, swelling, or pain. ? Fluid or blood. ? Warmth. ? Pus or a bad smell. Activity  Rest as told by your health care provider, usually for 1-2 days.  Do not lift anything that is heavier than 10 lbs. (4.5 kg) or as told by your health care provider.  Do not drive for 24 hours if you were given a medicine to help you relax (sedative).  Do not drive or use heavy machinery while taking prescription pain medicine. General instructions   Return to your normal activities as told by your health care provider, usually in about a week. Ask your health care provider what activities are safe for you.  If the catheter site starts bleeding, lie flat and put pressure on the site. If the bleeding does not stop, get help right away. This is a medical emergency.  Drink enough fluid to keep your urine  clear or pale yellow. This helps flush the contrast dye from your body.  Take over-the-counter and prescription medicines only as told by your health care provider.  Keep all follow-up visits as told by your health care provider. This is important. Contact a health care provider if:  You have a fever or chills.  You have redness, swelling, or pain around your insertion site.  You have fluid or blood coming from your insertion site.  The insertion site feels warm to the touch.  You have pus or a bad smell coming from your insertion site.  You have bruising around the insertion site.  You notice blood collecting in the tissue around the catheter site (hematoma). The hematoma may be painful to the touch. Get help right away if:  You have severe pain at the catheter insertion area.  The catheter insertion area swells very fast.  The catheter insertion   area is bleeding, and the bleeding does not stop when you hold steady pressure on the area.  The area near or just beyond the catheter insertion site becomes pale, cool, tingly, or numb. These symptoms may represent a serious problem that is an emergency. Do not wait to see if the symptoms will go away. Get medical help right away. Call your local emergency services (911 in the [place] Do not drive yourself to the hospital. Summary  After the procedure, it is common to have bruising and tenderness at the catheter insertion area.  After the procedure, it is important to rest and drink plenty of fluids.  Do not take baths, swim, or use a hot tub until your health care provider says it is okay to do so. You may shower 24-48 hours after the procedure or as told by your health care provider.  If the catheter site starts bleeding, lie flat and put pressure on the site. If the bleeding does not stop, get help right away. This is a medical emergency. This information is not intended to replace advice given to you by your health care provider.  Make sure you discuss any questions you have with your health care provider. Document Revised: 09/04/2017 Document Reviewed: 08/27/2016 Elsevier Patient Education  2020 Elsevier Inc[employer]  

## 2020-05-25 ENCOUNTER — Other Ambulatory Visit: Payer: BC Managed Care – PPO

## 2020-12-20 ENCOUNTER — Encounter: Payer: Self-pay | Admitting: Dietician

## 2020-12-20 ENCOUNTER — Encounter: Payer: BC Managed Care – PPO | Attending: Internal Medicine | Admitting: Dietician

## 2020-12-20 ENCOUNTER — Other Ambulatory Visit: Payer: Self-pay

## 2020-12-20 VITALS — Ht 72.0 in | Wt 181.1 lb

## 2020-12-20 DIAGNOSIS — E109 Type 1 diabetes mellitus without complications: Secondary | ICD-10-CM

## 2020-12-20 NOTE — Progress Notes (Signed)
Medical Nutrition Therapy: Visit start time: 1630  end time: 1750  Assessment:  Diagnosis: Type 1 diabetes Past medical history: hypoglycemia Psychosocial issues/ stress concerns: none  Preferred learning method:  . Auditory-- discussion   Current weight: 181.1lbs with shoes  Height: 6'0"  Medications, supplements: reconciled list in medical record  Progress and evaluation:   Diabetes diagnosis was 1987. Patient uses Tandem T slim pump, and Dexcom continuous glucose monitor. He has some readings in 200s daily; readings above 270 occur once every 3-4 weeks on average.  Uses calorie king app for carb counting sporadically.   Patient reports increase in fatigue especially in afternoon/ evenings; he does not have much energy to cook meals, so relies on restaurant meals or frozen meals. Many lunches are out with coworkers.  He would like to improve BG control, increase energy, and lose about 5-10lbs.   He is making plans to retire in the near future.    Physical activity: cardio and strength training 60 minutes 2x a week; none in past 2 weeks due to work issues  Dietary Intake:  Usual eating pattern includes 3 meals and 0-2 snacks per day. Dining out frequency: 10-13 meals per week.  Breakfast: granola bar; cereal bar aout 20g carb; eggs with cheese, grits; cereal; rare cheese danish; steak and cheese biscuit Snack: none Lunch: usually out-- pizza; cracker barrel; fast food ie double cheeseburger and fries; sometimes salad with large amount of Ranch dressing (estimates at least 1/2 cup); occasionally brings food from home Snack: none unless BG low; occ gatorade when exercising (36g CHO) Supper: sandwich; microwave meal; popcorn (if out at lunch);  Snack: none unless BG low -- 2-3 cookies and milk ([employer]) Beverages: Sparkling ice sugar free drinks; coffee; sugar alcohols cause diarrhea  Nutrition Care Education: Topics covered:  Basic nutrition: basic food groups, appropriate  nutrient balance, appropriate meal and snack schedule, general nutrition guidelines    Weight control: importance of low sugar and low fat choices, portion control , estimated energy needs for weight loss at 1900kcal, provided guidance for 45% CHO, 25% protein, (30% fat) Advanced nutrition:  Carb counting; cooking techniques for quick and balanced meals Diabetes:  appropriate carb intake and balance, healthy carb choices, role of fiber, protein, fat; resources to aid in carb counting and meal planning  Nutritional Diagnosis:  Travis Bishop-2.2 Altered nutrition-related laboratory As related to Type 1 diabetes.  As evidenced by elevated HbA1C and patient report of hyperglycemic episodes.  Intervention:  . Instruction and discussion as noted above. . Patient voices readiness to make dietary and lifestyle changes to improve BG control and overall health. . Established goals for change with input from patient.  Education Materials given:  . Plate Planner with food lists, sample meal pattern . Sample menus . Visit summary with goals/ instructions   Learner/ who was taught:  . Patient  . Spouse/ partner  Level of understanding: Marland Kitchen Verbalizes/ demonstrates competency   Demonstrated degree of understanding via:   Teach back Learning barriers: . None  Willingness to learn/ readiness for change: . Acceptance, ready for change   Monitoring and Evaluation:  Dietary intake, exercise, BG control, and body weight      follow up: 01/28/21 at 4:15pm

## 2020-12-20 NOTE — Patient Instructions (Signed)
   Use meal and menu ideas to eat more meals at home. Consider options like cooking/ grilling extra meat and freeze leftovers for future meals.  Reduce the amount of salad dressing by dipping in cup instead of pouring on top of the salad.   Use healthy frozen meals as easy options.  Control portions of carbs, ideally keep to 45-60 grams with meals to help improve consistency of blood sugars.

## 2021-01-28 ENCOUNTER — Other Ambulatory Visit: Payer: Self-pay

## 2021-01-28 ENCOUNTER — Encounter: Payer: Self-pay | Admitting: Dietician

## 2021-01-28 ENCOUNTER — Encounter: Payer: BC Managed Care – PPO | Attending: Internal Medicine | Admitting: Dietician

## 2021-01-28 VITALS — Ht 72.0 in | Wt 179.5 lb

## 2021-01-28 DIAGNOSIS — E109 Type 1 diabetes mellitus without complications: Secondary | ICD-10-CM | POA: Insufficient documentation

## 2021-01-28 NOTE — Patient Instructions (Signed)
   Continue to work on accurate carb counts for meals and use the 1:6 ratio for bolusing.   If you miscalculate carbs and have high BG 1-2 hours after eating, don't "fix" the bolus; just do a correction bolus for the blood sugar reading. If you take extra insulin for carbs you have eaten 1-2 hours before, you could end up with a low blood sugar due to timing. Blood sugar starts to go back down 2-3 hours after eating.   Be cautious with boluses, (think of changing a thermostat only a few degrees) so you can ease blood sugars back to goal range instead of going from high lo low or low to high.

## 2021-01-28 NOTE — Progress Notes (Signed)
Medical Nutrition Therapy: Visit start time: 1620  end time: 1650  Assessment:  Diagnosis: Type 1 DM Medical history changes: no changes Psychosocial issues/ stress concerns: patient reports high stress level  Current weight: 179.5lbs Height: 6'0" Medications, supplement changes: no changes  Progress and evaluation:  . Patient reports ongoing BG fluctuations from 60s- 250+.  Marland Kitchen He reports an incident recently when he passed out in his driveway and neighbors called EMS. BG was 38.  Marland Kitchen Checked BG reading during visit at 4:30pm with reading of 146 after pork chop and peas for lunch. . Has been eating more meals at home, some [employer] frozen meals . He reports ICR = 1 unit per 6g CHO . He reports having an 8-inch sub with chips for a meal, estimated 45g CHO and bolused for that amount. He later realized when checking his carb counting app that he actually consumed 110g CHO, and bolused again based on the 65g carb he didn't account for earlier.    Physical activity: not assessed today  Dietary Intake:  Usual eating pattern includes 3 meals and 1-2 snacks per day. Dining out frequency: <5 meals per week.  Breakfast: granola or cereal bar + coffee; occasionally eggs Snack: small bag trail mix; Nekot cookies Lunch: usually from home -- meat + starch + veg/ salad/  Snack: none Supper: frozen pizza; PBJ sandwich and fritos chips (had balanced lunch) Snack: 2-3 cookies; peanut butter from spoon; trail mix Beverages: sparkling ice drinks, 2% milk  Nutrition Care Education: Topics covered:  Diabetes:  Reviewed carb counting, insulin bolus timing in regards to food intake and using food bolus only prior to meal -- using only correction bolus after meals; avoiding overtreatment of high and low BGs; effects of stress on blood sugars  Nutritional Diagnosis:  [place]-2.2 Altered nutrition-related laboratory As related to Type 1 diabetes.  As evidenced by elevated HbA1C and patient report of  hyperglycemic episodes.  Intervention:  . Instruction and discussion as noted above. . Patient feels confident he can practice carb counting accurately. He acknowledges he needs to work on better insulin dosing with bolusing.  Marland Kitchen Updated goals for change with input from patient. . No additional MNT follow up scheduled; patient will call with questions/ schedule follow up later if needed.   Education Materials given:  Marland Kitchen Visit summary with goals/ instructions   Learner/ who was taught:  . Patient   Level of understanding: Marland Kitchen Verbalizes/ demonstrates competency   Demonstrated degree of understanding via:   Teach back Learning barriers: . None  Willingness to learn/ readiness for change: . Eager, change in progress   Monitoring and Evaluation:  Dietary intake, exercise, BG control, and body weight      follow up: prn

## 2021-02-02 ENCOUNTER — Other Ambulatory Visit: Payer: Self-pay | Admitting: Urology

## 2021-02-04 NOTE — Telephone Encounter (Signed)
rx denied pt last seen 2017? Phone note from 2020 advised to follow-up with [facility], all appts have been canceled. Pt needs to be seen

## 2021-10-17 ENCOUNTER — Ambulatory Visit (INDEPENDENT_AMBULATORY_CARE_PROVIDER_SITE_OTHER): Payer: BC Managed Care – PPO

## 2021-10-17 ENCOUNTER — Other Ambulatory Visit: Payer: Self-pay

## 2021-10-17 ENCOUNTER — Ambulatory Visit
Admission: EM | Admit: 2021-10-17 | Discharge: 2021-10-17 | Disposition: A | Discharge status: Home / Self Care | Payer: BC Managed Care – PPO | Attending: Physician Assistant | Admitting: Physician Assistant

## 2021-10-17 DIAGNOSIS — R059 Cough, unspecified: Secondary | ICD-10-CM | POA: Diagnosis not present

## 2021-10-17 DIAGNOSIS — R051 Acute cough: Secondary | ICD-10-CM | POA: Diagnosis not present

## 2021-10-17 DIAGNOSIS — Z8616 Personal history of COVID-19: Secondary | ICD-10-CM | POA: Diagnosis not present

## 2021-10-17 MED ORDER — PROMETHAZINE-DM 6.25-15 MG/5ML PO SYRP: 5.0000 mL | ORAL_SOLUTION | Freq: Four times a day (QID) | ORAL | 0 refills | Status: AC | PRN

## 2021-10-17 MED ORDER — BENZONATATE 200 MG PO CAPS: 200.0000 mg | ORAL_CAPSULE | Freq: Three times a day (TID) | ORAL | 0 refills | Status: DC | PRN

## 2021-10-17 NOTE — Discharge Instructions (Addendum)
-  Your chest x-ray is normal.  You do not have pneumonia. - You just have a lingering cough.  It may take a little longer for the cough to go away.  Hopefully feeling better in the next week.  I sent a couple cough medicines for you. - Increase rest and fluids and keep a check on your blood sugars. - Also consider drinking a lot of water and throat lozenges. -Hope you have a good vacation!

## 2021-10-17 NOTE — ED Provider Notes (Signed)
[facility]    CSN: [id number] Arrival date & time: 10/17/21  1700      History   Chief Complaint Chief Complaint  Patient presents with   Cough    HPI Travis Bishop is a 61 y.o. male presenting for 3-week history of cough.  He says it is dry.  He was diagnosed with COVID-19 3 weeks ago.  He says he is tried every over-the-counter cough medicine they have and it has not helped.  Denies any worsening of symptoms but says he is sick of the cough.  He has not had any fever, fatigue, chest pain or breathing trouble.  Patient says he is going to [place] on Monday and would like to feel better by then and not be coughing.  He also is afraid he can get kicked off the plane due to the cough.  HPI  Past Medical History:  Diagnosis Date   Anxiety    Arrhythmia    Depression    Diabetes ([place])    GERD (gastroesophageal reflux disease)    Heart disease    Heart murmur    Sleep apnea     Patient Active Problem List   Diagnosis Date Noted   Hernia, abdominal 12/17/2016   Anxiety state 07/18/2016   Dyspepsia 07/18/2016   Personal history of other diseases of the musculoskeletal system and connective tissue 11/23/2015   Chromophytosis 11/23/2015   Feeling bilious 06/04/2015   Dry cough 06/04/2015   Type 1 diabetes mellitus without complication ([place]) 99991111    Past Surgical History:  Procedure Laterality Date   LEFT HEART CATH AND CORONARY ANGIOGRAPHY Left 05/17/2020   Procedure: LEFT HEART CATH AND CORONARY ANGIOGRAPHY;  Surgeon: Isaias Cowman, MD;  Location: [facility];  Service: Cardiovascular;  Laterality: Left;   none         Home Medications    Prior to Admission medications   Medication Sig Start Date End Date Taking? Authorizing Provider  albuterol (VENTOLIN HFA) 108 (90 Base) MCG/ACT inhaler Inhale 2 puffs into the lungs every 6 (six) hours as needed for wheezing or shortness of breath.  06/04/15  Yes [provider]   alfuzosin (UROXATRAL) 10 MG 24 hr tablet Take 10 mg by mouth at bedtime.   Yes [provider]  aspirin EC 81 MG tablet Take 81 mg by mouth daily. Swallow whole.   Yes [provider]  aspirin-acetaminophen-caffeine (EXCEDRIN MIGRAINE) [phone] MG tablet Take 1 tablet by mouth daily as needed for headache or migraine.   Yes [provider]  atorvastatin (LIPITOR) 20 MG tablet Take 20 mg by mouth at bedtime.   Yes [provider]  benzonatate (TESSALON) 200 MG capsule Take 1 capsule (200 mg total) by mouth 3 (three) times daily as needed for cough. 10/17/21  Yes Laurene Footman B, PA-C  BIOTIN PO Take 1 tablet by mouth daily.   Yes [provider]  busPIRone (BUSPAR) 15 MG tablet Take 7.5 mg by mouth daily.  10/03/15  Yes [provider]  Continuous Blood Gluc Sensor (DEXCOM G6 SENSOR) MISC Use 1 each every 10 (ten) days 11/05/20  Yes [provider]  CONTOUR NEXT TEST test strip USE 4 (FOUR) TIMES DAILY 03/08/19  Yes [provider]  Melatonin 5 MG CAPS Take 5-10 mg by mouth at bedtime as needed (sleep).   Yes [provider]  NOVOLOG 100 UNIT/ML injection Inject 60 Units into the skin daily. Via insulin pump 11/01/15  Yes  [provider]  omeprazole (PRILOSEC) 20 MG capsule Take 20 mg by mouth See admin instructions. Take 20 mg daily, may take a second 20 mg dose as needed for acid reflux 03/04/19  Yes [provider]  promethazine-dextromethorphan (PROMETHAZINE-DM) 6.25-15 MG/5ML syrup Take 5 mLs by mouth 4 (four) times daily as needed. 10/17/21  Yes Laurene Footman B, PA-C  sertraline (ZOLOFT) 50 MG tablet Take 50 mg by mouth daily.  07/03/15  Yes [provider]  lisinopril (ZESTRIL) 10 MG tablet Take 10 mg by mouth daily.  10/19/18 12/20/20  [provider]    Family History Family History  Problem Relation Age of Onset   Bladder Cancer Father    Prostate cancer Neg Hx    Kidney  cancer Neg Hx     Social History Social History   Tobacco Use   Smoking status: Never   Smokeless tobacco: Never  Vaping Use   Vaping Use: Never used  Substance Use Topics   Alcohol use: Yes    Alcohol/week: 0.0 standard drinks    Comment: occsional   Drug use: No     Allergies   Patient has no known allergies.   Review of Systems Review of Systems  Constitutional:  Negative for fatigue and fever.  HENT:  Negative for congestion, rhinorrhea, sinus pain and sore throat.   Respiratory:  Positive for cough. Negative for shortness of breath and wheezing.   Cardiovascular:  Negative for chest pain.  Gastrointestinal:  Negative for diarrhea, nausea and vomiting.  Musculoskeletal:  Negative for myalgias.  Neurological:  Negative for weakness, light-headedness and headaches.  Hematological:  Negative for adenopathy.    Physical Exam Triage Vital Signs ED Triage Vitals  Enc Vitals Group     BP 10/17/21 1710 125/68     Pulse Rate 10/17/21 1710 (!) 52     Resp 10/17/21 1710 18     Temp 10/17/21 1710 98 F (36.7 C)     Temp Source 10/17/21 1710 Oral     SpO2 10/17/21 1710 99 %     Weight 10/17/21 1708 185 lb (83.9 kg)     Height 10/17/21 1708 6' (1.829 m)     Head Circumference --      Peak Flow --      Pain Score 10/17/21 1707 0     Pain Loc --      Pain Edu? --      Excl. in [place]? --    No data found.  Updated Vital Signs BP 125/68 (BP Location: Left Arm)    Pulse (!) 52    Temp 98 F (36.7 C) (Oral)    Resp 18    Ht 6' (1.829 m)    Wt 185 lb (83.9 kg)    SpO2 99%    BMI 25.09 kg/m      Physical Exam Vitals and nursing note reviewed.  Constitutional:      General: He is not in acute distress.    Appearance: Normal appearance. He is well-developed. He is not ill-appearing.  HENT:     Head: Normocephalic and atraumatic.     Nose: Nose normal.     Mouth/Throat:     Mouth: Mucous membranes are moist.     Pharynx: Oropharynx is clear.  Eyes:     General: No  scleral icterus.    Conjunctiva/sclera: Conjunctivae normal.  Cardiovascular:     Rate and Rhythm: Regular rhythm. Bradycardia present.     Heart sounds: Normal  heart sounds.  Pulmonary:     Effort: Pulmonary effort is normal. No respiratory distress.     Breath sounds: Normal breath sounds.  Musculoskeletal:     Cervical back: Neck supple.  Skin:    General: Skin is warm and dry.     Capillary Refill: Capillary refill takes less than 2 seconds.  Neurological:     General: No focal deficit present.     Mental Status: He is alert. Mental status is at baseline.     Motor: No weakness.     Coordination: Coordination normal.     Gait: Gait normal.  Psychiatric:        Mood and Affect: Mood normal.        Behavior: Behavior normal.        Thought Content: Thought content normal.     UC Treatments / Results  Labs (all labs ordered are listed, but only abnormal results are displayed) Labs Reviewed - No data to display  EKG   Radiology DG Chest 2 View  Result Date: 10/17/2021 CLINICAL DATA:  Persistent cough since COVID infection 3 weeks ago. EXAM: CHEST - 2 VIEW COMPARISON:  Chest x-ray report dated Mar 04, 2019. FINDINGS: The heart size and mediastinal contours are within normal limits. Both lungs are clear. The visualized skeletal structures are unremarkable. IMPRESSION: No active cardiopulmonary disease. Electronically Signed   By: Titus Dubin M.D.   On: 10/17/2021 17:39    Procedures Procedures (including critical care time)  Medications Ordered in UC Medications - No data to display  Initial Impression / Assessment and Plan / UC Course  I have reviewed the triage vital signs and the nursing notes.  Pertinent labs & imaging results that were available during my care of the patient were reviewed by me and considered in my medical decision making (see chart for details).  61 year old male with history of COVID-19 3 weeks ago presents for continued cough.  No worsening  of symptoms.  No fever or breathing difficulty.  Patient is overall well-appearing.  He is afebrile but oxygen signs are present.  He coughs a couple of times during the visit.  His chest is clear auscultation heart regular rhythm.   Chest x-ray performed.  X-ray is normal.  No evidence of pneumonia.  Discussed this with him.  Advised patient is not uncommon to have persistent cough with COVID-19.  Supportive care encouraged with increasing rest and fluids, throat lozenges, also sent benzonatate and Promethazine DM to pharmacy.  Reviewed returning or going to ER for any worsening symptoms.   Final Clinical Impressions(s) / UC Diagnoses   Final diagnoses:  Acute cough  Personal history of COVID-19     Discharge Instructions      -Your chest x-ray is normal.  You do not have pneumonia. - You just have a lingering cough.  It may take a little longer for the cough to go away.  Hopefully feeling better in the next week.  I sent a couple cough medicines for you. - Increase rest and fluids and keep a check on your blood sugars. - Also consider drinking a lot of water and throat lozenges. -Hope you have a good vacation!     ED Prescriptions     Medication Sig Dispense Auth. Provider   benzonatate (TESSALON) 200 MG capsule Take 1 capsule (200 mg total) by mouth 3 (three) times daily as needed for cough. 30 capsule Danton Clap, PA-C   promethazine-dextromethorphan (PROMETHAZINE-DM) 6.25-15 MG/5ML syrup Take  5 mLs by mouth 4 (four) times daily as needed. 118 mL Danton Clap, PA-C      I have reviewed the PDMP during this encounter.   Danton Clap, PA-C 10/17/21 1810

## 2021-10-17 NOTE — ED Triage Notes (Signed)
Pt states that he had covid 3 weeks ago. Pt states that the cough has continued. Pt was instructed to take robitussin and delsym and that it is not working.

## 2021-12-21 ENCOUNTER — Other Ambulatory Visit: Payer: Self-pay

## 2021-12-21 ENCOUNTER — Ambulatory Visit
Admission: EM | Admit: 2021-12-21 | Discharge: 2021-12-21 | Disposition: A | Discharge status: Home / Self Care | Payer: BC Managed Care – PPO | Attending: Internal Medicine | Admitting: Internal Medicine

## 2021-12-21 DIAGNOSIS — J029 Acute pharyngitis, unspecified: Secondary | ICD-10-CM | POA: Diagnosis present

## 2021-12-21 LAB — GROUP A STREP BY PCR: Group A Strep by PCR: NOT DETECTED

## 2021-12-21 MED ORDER — BENZONATATE 200 MG PO CAPS: 200.0000 mg | ORAL_CAPSULE | Freq: Every evening | ORAL | 0 refills | Status: AC | PRN

## 2021-12-21 MED ORDER — PREDNISONE 10 MG PO TABS: 20.0000 mg | ORAL_TABLET | Freq: Every day | ORAL | 0 refills | Status: DC

## 2021-12-21 MED ORDER — AMOXICILLIN-POT CLAVULANATE 875-125 MG PO TABS: 1.0000 | ORAL_TABLET | Freq: Two times a day (BID) | ORAL | 0 refills | Status: AC

## 2021-12-21 NOTE — Discharge Instructions (Addendum)
I am concerned you may be getting a peritonsillar abscess, so I will treat you for that before you get worse. But if you do get worse, go to the ER.  ?

## 2021-12-21 NOTE — ED Triage Notes (Signed)
Patient is here for "Congestion, St". "? Getting over CJ, had this for about a week". "Intense st on right side, when swallowing". Drainage "with cough" when lying down at night. No fever. Currently on Rx for Eyes (CJ).  ?

## 2021-12-21 NOTE — ED Provider Notes (Signed)
[facility]    CSN: [id number] Arrival date & time: 12/21/21  1025      History   Chief Complaint Chief Complaint  Patient presents with   Nasal Congestion   Sore Throat    HPI Travis Bishop is a 61 y.o. male who presents with URI x 1 week. Has severe ST on the R side when swallowing. The cough gets worse qhs when laying down. Denies fever. Is finishing up treatment for bacterial conjunctivitis. His cough is mostly non productive, but after he showers gets productive and mucous is clear.     Past Medical History:  Diagnosis Date   Anxiety    Arrhythmia    Depression    Diabetes (HCC)    GERD (gastroesophageal reflux disease)    Heart disease    Heart murmur    Sleep apnea     Patient Active Problem List   Diagnosis Date Noted   Hernia, abdominal 12/17/2016   Anxiety state 07/18/2016   Dyspepsia 07/18/2016   Personal history of other diseases of the musculoskeletal system and connective tissue 11/23/2015   Chromophytosis 11/23/2015   Feeling bilious 06/04/2015   Dry cough 06/04/2015   Type 1 diabetes mellitus without complication (HCC) 08/18/2014    Past Surgical History:  Procedure Laterality Date   LEFT HEART CATH AND CORONARY ANGIOGRAPHY Left 05/17/2020   Procedure: LEFT HEART CATH AND CORONARY ANGIOGRAPHY;  Surgeon: Marcina Millard, MD;  Location: [facility] INV[facility]IVE CV [facility];  Service: Cardiovascular;  Laterality: Left;   none         Home Medications    Prior to Admission medications   Medication Sig Start Date End Date Taking? Authorizing Provider  amoxicillin-clavulanate (AUGMENTIN) 875-125 MG tablet Take 1 tablet by mouth every 12 (twelve) hours. 12/21/21  Yes Rodriguez-Southworth, Viviana Simpler  aspirin EC 81 MG tablet Take 81 mg by mouth daily. Swallow whole.   Yes [provider]  atorvastatin (LIPITOR) 20 MG tablet Take 40 mg by mouth at bedtime. 2 20mg  in AM   Yes [provider]  benzonatate (TESSALON) 200 MG  capsule Take 1 capsule (200 mg total) by mouth at bedtime as needed for cough. 12/21/21  Yes Rodriguez-Southworth, Nettie Elm, PA-C  busPIRone (BUSPAR) 15 MG tablet Take 7.5 mg by mouth daily.  10/03/15  Yes [provider]  Continuous Blood Gluc Sensor (DEXCOM G6 SENSOR) MISC Use 1 each every 10 (ten) days 11/05/20  Yes [provider]  erythromycin ophthalmic ointment SMARTSIG:sparingly In Eye(s) Every Night 12/16/21  Yes [provider]  lisinopril (ZESTRIL) 10 MG tablet Take 1 tablet by mouth daily. 04/05/21  Yes [provider]  NOVOLOG 100 UNIT/ML injection Inject 60 Units into the skin daily. Via insulin pump 11/01/15  Yes [provider]  omeprazole (PRILOSEC) 20 MG capsule Take 20 mg by mouth See admin instructions. Take 20 mg daily, may take a second 20 mg dose as needed for acid reflux 03/04/19  Yes [provider]  predniSONE (DELTASONE) 10 MG tablet Take 2 tablets (20 mg total) by mouth daily. 12/21/21  Yes Rodriguez-Southworth, Nettie Elm, PA-C  sertraline (ZOLOFT) 50 MG tablet Take 1 tablet by mouth daily. 05/08/21  Yes [provider]  tobramycin-dexamethasone (TOBRADEX) ophthalmic solution 1 drop 4 (four) times daily. 12/16/21  Yes [provider]  albuterol (VENTOLIN HFA) 108 (90 Base) MCG/ACT inhaler Inhale 2 puffs into the lungs every 6 (six) hours as needed for wheezing or shortness of breath.  06/04/15  [provider]  aspirin-acetaminophen-caffeine (EXCEDRIN MIGRAINE) [phone] MG tablet Take 1 tablet by mouth daily as needed for headache or migraine.    [provider]  BIOTIN PO Take 1 tablet by mouth daily.    [provider]  CONTOUR NEXT TEST test strip USE 4 (FOUR) TIMES DAILY 03/08/19   [provider]  glucagon 1 MG injection 1 mg IM in case of severe hypoglycemia 03/08/21   [provider]  LAGEVRIO 200 MG CAPS capsule Take by mouth. 10/03/21   [provider]   promethazine-dextromethorphan (PROMETHAZINE-DM) 6.25-15 MG/5ML syrup Take 5 mLs by mouth 4 (four) times daily as needed. 10/17/21   Shirlee Latch, PA-C    Family History Family History  Problem Relation Age of Onset   Bladder Cancer Father    Prostate cancer Neg Hx    Kidney cancer Neg Hx     Social History Social History   Tobacco Use   Smoking status: Never   Smokeless tobacco: Never  Vaping Use   Vaping Use: Never used  Substance Use Topics   Alcohol use: Yes    Alcohol/week: 0.0 standard drinks    Comment: occsional   Drug use: No     Allergies   Patient has no known allergies.   Review of Systems Review of Systems  Constitutional:  Negative for appetite change, chills, diaphoresis, fatigue and fever.  HENT:  Positive for postnasal drip, sore throat and trouble swallowing. Negative for ear discharge and ear pain.   Eyes:  Negative for discharge.  Respiratory:  Positive for cough.   Musculoskeletal:  Negative for myalgias.  Neurological:  Negative for headaches.    Physical Exam Triage Vital Signs ED Triage Vitals  Enc Vitals Group     BP 12/21/21 1049 127/75     Pulse Rate 12/21/21 1049 (!) 58     Resp 12/21/21 1049 18     Temp 12/21/21 1049 98 F (36.7 C)     Temp Source 12/21/21 1049 Oral     SpO2 12/21/21 1049 99 %     Weight 12/21/21 1042 185 lb (83.9 kg)     Height 12/21/21 1042 6' (1.829 m)     Head Circumference --      Peak Flow --      Pain Score 12/21/21 1042 4     Pain Loc --      Pain Edu? --      Excl. in GC? --    No data found.  Updated Vital Signs BP 127/75 (BP Location: Left Arm)   Pulse (!) 58   Temp 98 F (36.7 C) (Oral)   Resp 18   Ht 6' (1.829 m)   Wt 185 lb (83.9 kg)   SpO2 99%   BMI 25.09 kg/m   Visual Acuity Right Eye Distance:   Left Eye Distance:   Bilateral Distance:    Right Eye Near:   Left Eye Near:    Bilateral Near:     Physical Exam Vitals and nursing note reviewed.  Constitutional:       General: He is not in acute distress.    Appearance: He is not ill-appearing or toxic-appearing.  HENT:     Right Ear: Tympanic membrane and ear canal normal. No drainage.     Left Ear: Tympanic membrane and ear canal normal. No drainage.     Mouth/Throat:     Mouth: Mucous membranes are moist.     Pharynx: Uvula midline. No  oropharyngeal exudate.     Tonsils: No tonsillar exudate. 2+ on the right. 1+ on the left.     Comments: He does not have a muffled voice, and demonstrate no pain when he swallows R tonsil looks more swollen then the L.  Eyes:     Conjunctiva/sclera: Conjunctivae normal.  Pulmonary:     Effort: Pulmonary effort is normal.     Comments: Has mild expiratory wheezing on bases  Musculoskeletal:     Cervical back: Neck supple.  Lymphadenopathy:     Cervical: No cervical adenopathy.  Skin:    General: Skin is warm and dry.  Neurological:     Mental Status: He is alert and oriented to person, place, and time.  Psychiatric:        Mood and Affect: Mood normal.        Behavior: Behavior normal.     UC Treatments / Results  Labs (all labs ordered are listed, but only abnormal results are displayed) Labs Reviewed  GROUP A STREP BY PCR    EKG   Radiology No results found.  Procedures Procedures (including critical care time)  Medications Ordered in UC Medications - No data to display  Initial Impression / Assessment and Plan / UC Course  I have reviewed the triage vital signs and the nursing notes.  Pertinent labs  results that were available during my care of the patient were reviewed by me and considered in my medical decision making (see chart for details).  I am concerned he may be getting R peritonsillar abscess. I placed him on Augmentin, prednisone and Tessalon as noted. See instructions.    Final Clinical Impressions(s) / UC Diagnoses   Final diagnoses:  Pharyngitis, unspecified etiology     Discharge Instructions      I am  concerned you may be getting a peritonsillar abscess, so I will treat you for that before you get worse. But if you do get worse, go to the ER.      ED Prescriptions     Medication Sig Dispense Auth. Provider   amoxicillin-clavulanate (AUGMENTIN) 875-125 MG tablet Take 1 tablet by mouth every 12 (twelve) hours. 20 tablet Rodriguez-Southworth, Nettie Elm, PA-C   predniSONE (DELTASONE) 10 MG tablet Take 2 tablets (20 mg total) by mouth daily. 5 tablet Rodriguez-Southworth, Pavan Bring, PA-C   benzonatate (TESSALON) 200 MG capsule Take 1 capsule (200 mg total) by mouth at bedtime as needed for cough. 15 capsule Rodriguez-Southworth, Nettie Elm, [place]      I have reviewed the PDMP during this encounter.   Garey Ham, PA-C 12/21/21 1159

## 2022-06-13 ENCOUNTER — Other Ambulatory Visit: Payer: Self-pay | Admitting: Physician Assistant

## 2022-06-13 DIAGNOSIS — R0609 Other forms of dyspnea: Secondary | ICD-10-CM

## 2022-06-13 DIAGNOSIS — J45909 Unspecified asthma, uncomplicated: Secondary | ICD-10-CM

## 2022-06-24 ENCOUNTER — Ambulatory Visit: Payer: BC Managed Care – PPO | Attending: Physician Assistant

## 2022-06-24 DIAGNOSIS — J45909 Unspecified asthma, uncomplicated: Secondary | ICD-10-CM | POA: Insufficient documentation

## 2022-06-24 DIAGNOSIS — R0609 Other forms of dyspnea: Secondary | ICD-10-CM | POA: Diagnosis not present

## 2022-06-24 DIAGNOSIS — F17200 Nicotine dependence, unspecified, uncomplicated: Secondary | ICD-10-CM | POA: Diagnosis not present

## 2022-06-24 LAB — PULMONARY FUNCTION TEST ARMC ONLY
DL/VA % pred: 78 %
DL/VA: 3.26 ml/min/mmHg/L
DLCO unc % pred: 65 %
DLCO unc: 18.88 ml/min/mmHg
FEF 25-75 Post: 3.84 L/sec
FEF 25-75 Pre: 3.02 L/sec
FEF2575-%Change-Post: 27 %
FEF2575-%Pred-Post: 125 %
FEF2575-%Pred-Pre: 98 %
FEV1-%Change-Post: 5 %
FEV1-%Pred-Post: 85 %
FEV1-%Pred-Pre: 80 %
FEV1-Post: 3.23 L
FEV1-Pre: 3.06 L
FEV1FVC-%Change-Post: 7 %
FEV1FVC-%Pred-Pre: 108 %
FEV6-%Change-Post: -1 %
FEV6-%Pred-Post: 76 %
FEV6-%Pred-Pre: 77 %
FEV6-Post: 3.68 L
FEV6-Pre: 3.74 L
FEV6FVC-%Pred-Post: 104 %
FEV6FVC-%Pred-Pre: 104 %
FVC-%Change-Post: -1 %
FVC-%Pred-Post: 72 %
FVC-%Pred-Pre: 74 %
FVC-Post: 3.68 L
FVC-Pre: 3.74 L
Post FEV1/FVC ratio: 88 %
Post FEV6/FVC ratio: 100 %
Pre FEV1/FVC ratio: 82 %
Pre FEV6/FVC Ratio: 100 %
RV % pred: 93 %
RV: 2.24 L
TLC % pred: 81 %
TLC: 6.02 L

## 2022-10-07 IMAGING — CR DG CHEST 2V
3 series · 3 of 3 positions shown · non-contrast
Comparison: Chest x-ray report dated March 04, 2019.

CLINICAL DATA: Persistent cough since COVID infection 3 weeks ago.

EXAM:
CHEST - 2 VIEW

[chest pa (1 of 2)]
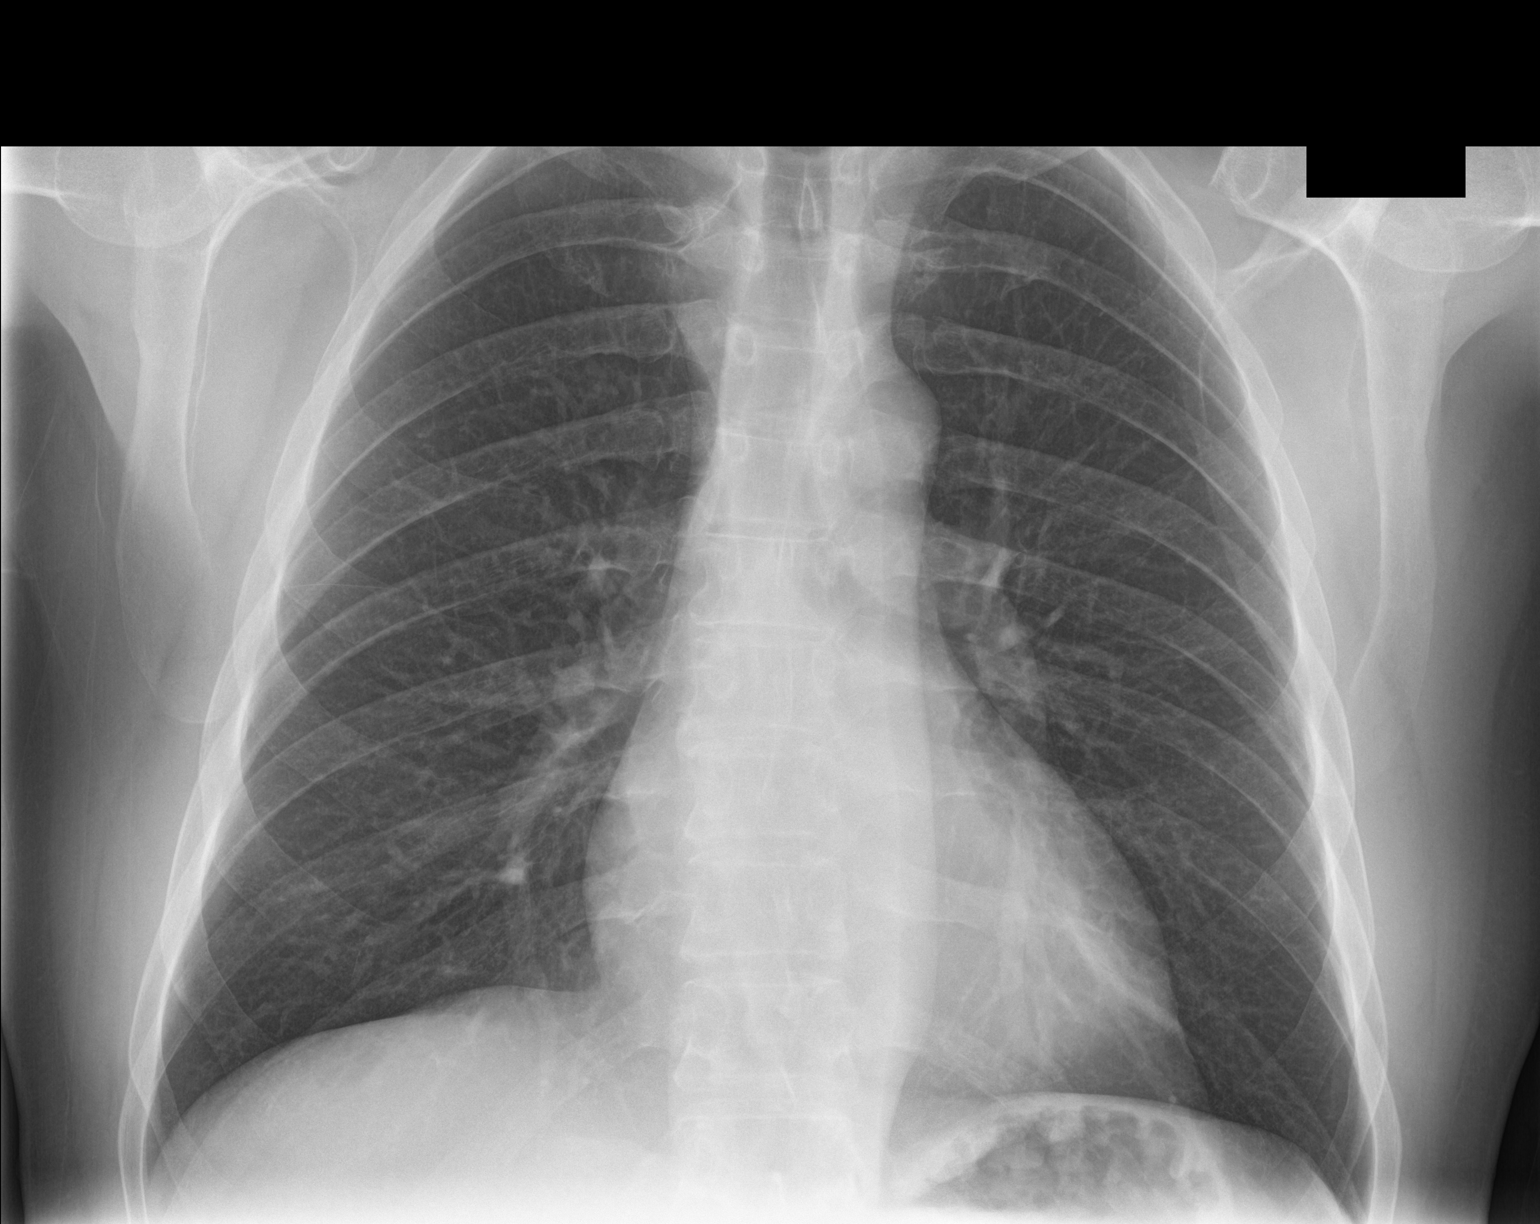

[chest lat]
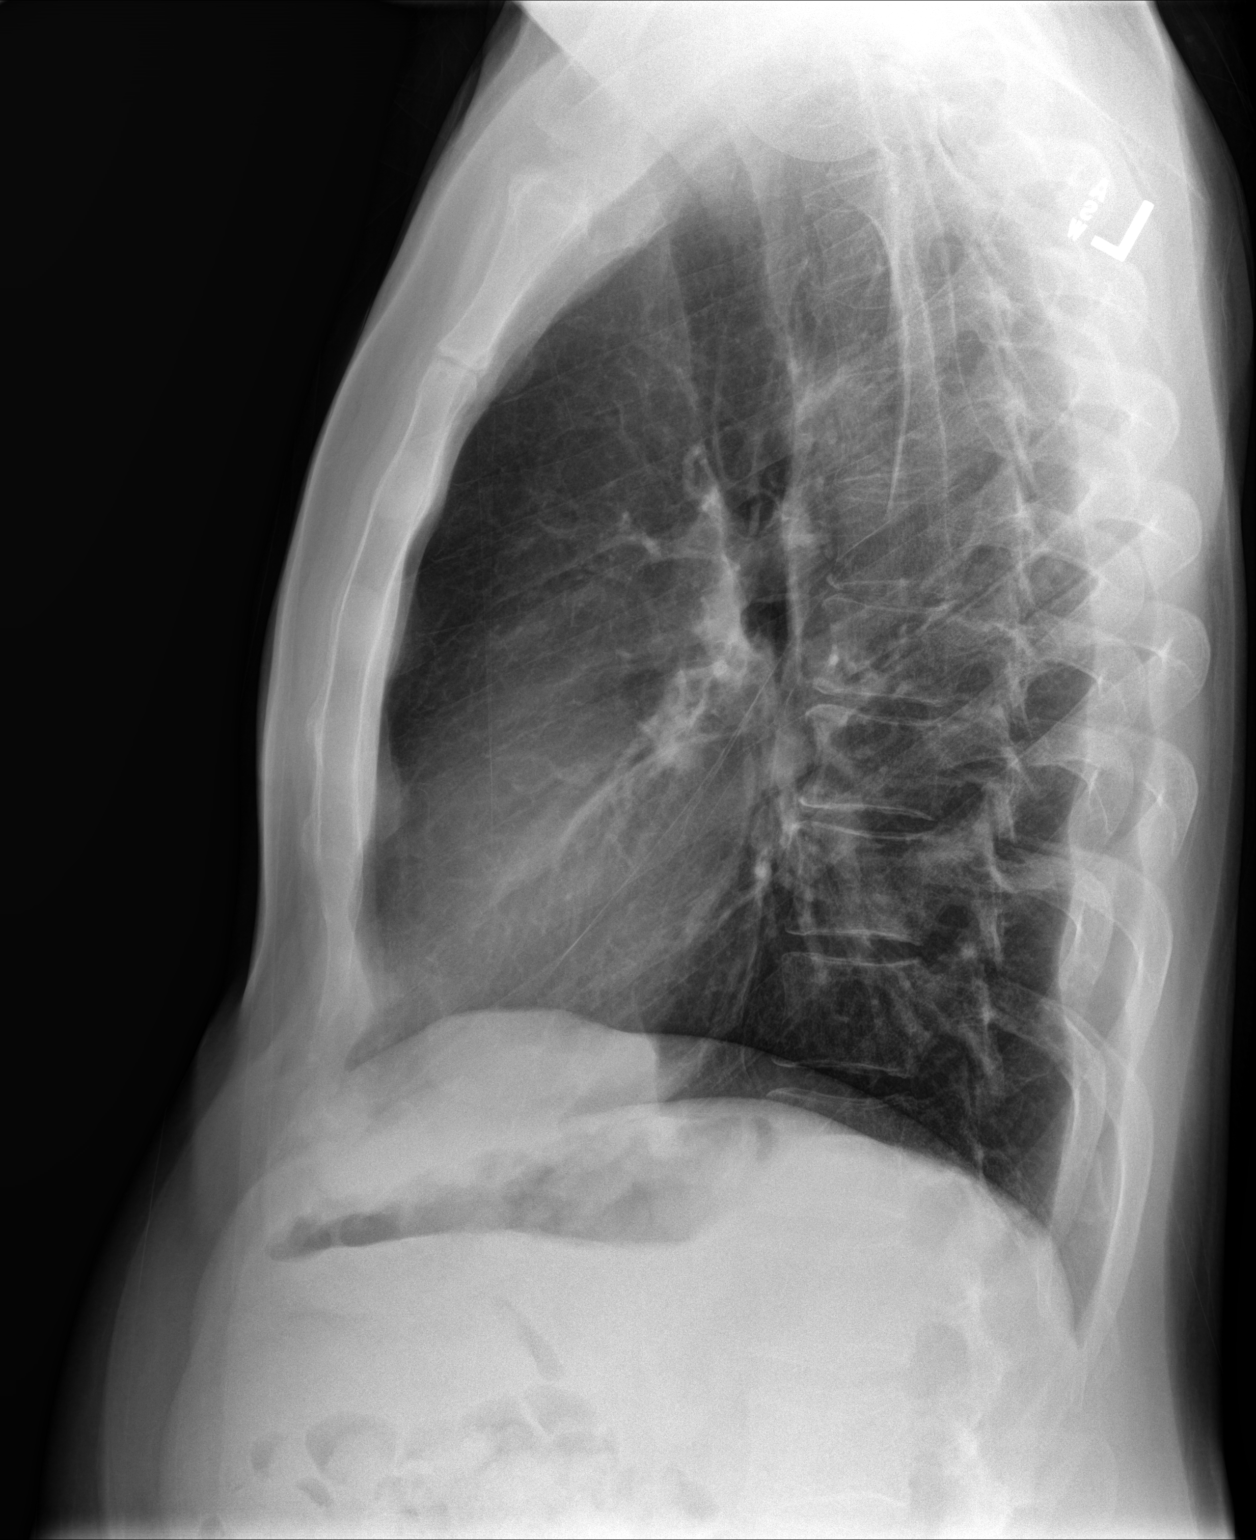

[chest pa (2 of 2)]
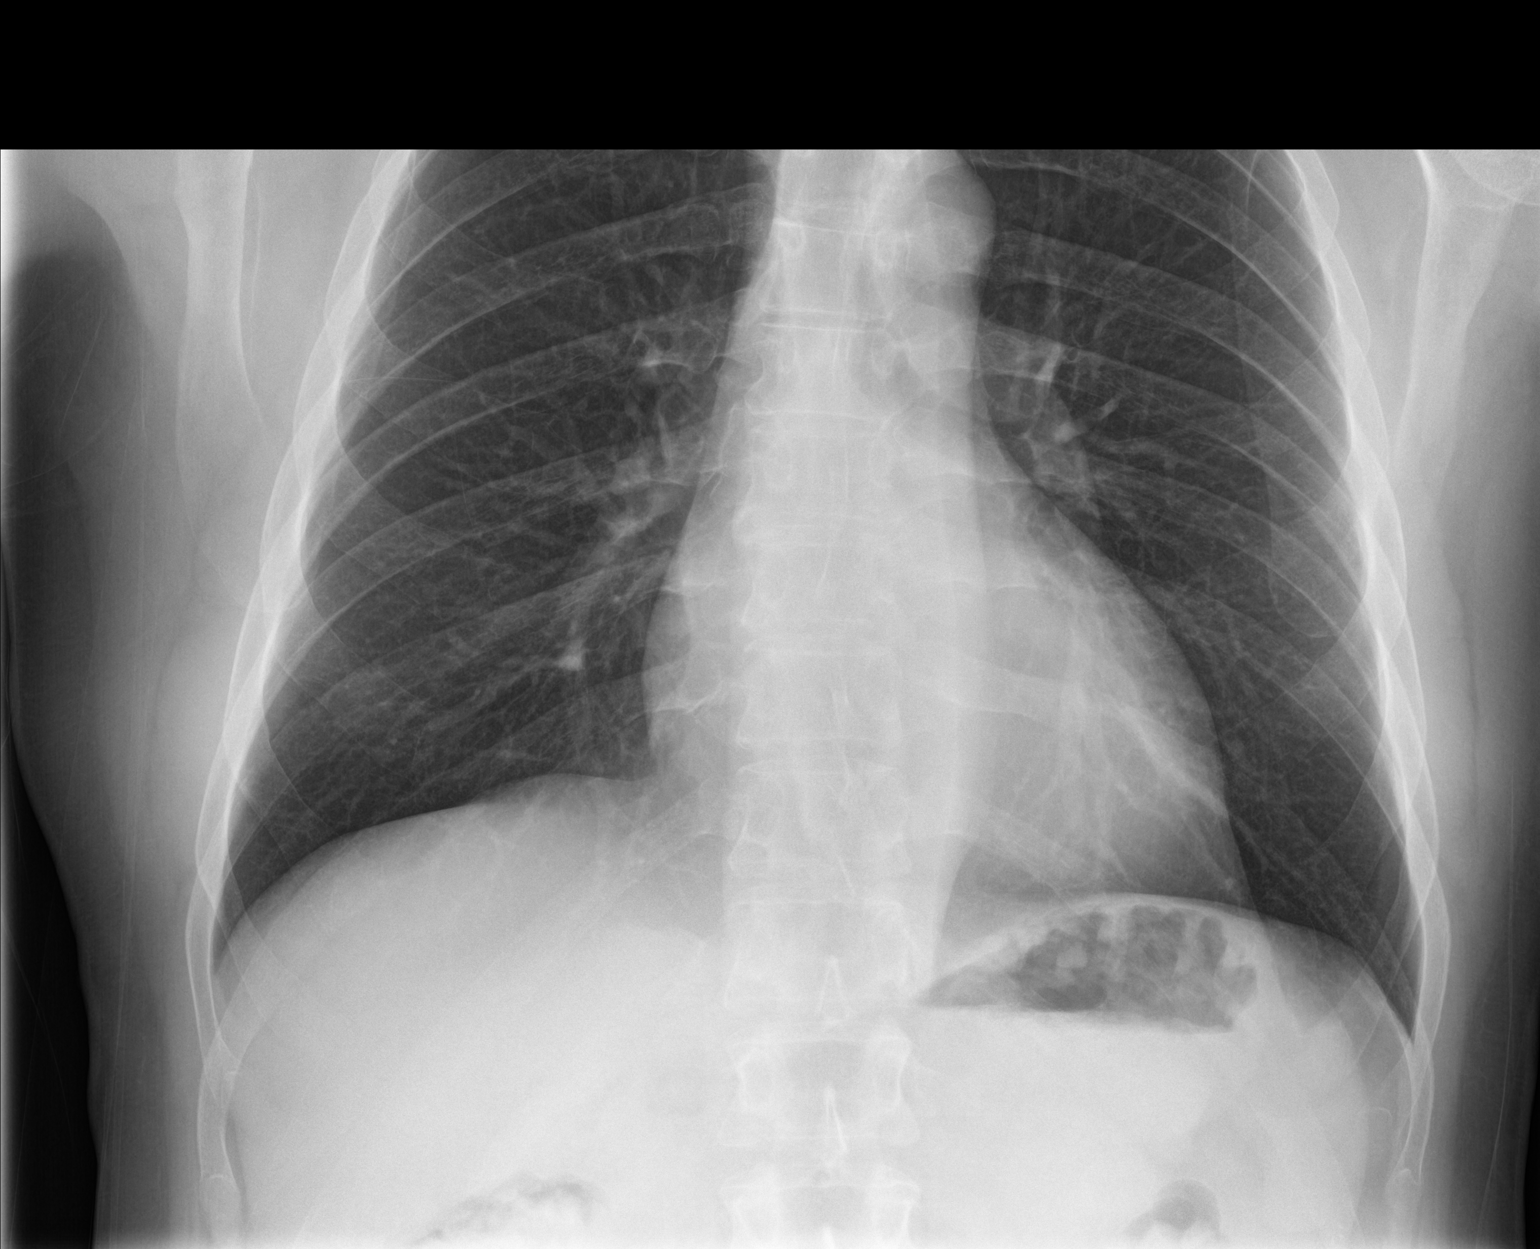

[3 of 3 positions shown; findings below may reference images not displayed]

FINDINGS: The heart size and mediastinal contours are within normal limits.
Both lungs are clear. The visualized skeletal structures are
unremarkable.
IMPRESSION: No active cardiopulmonary disease.

## 2023-01-01 ENCOUNTER — Other Ambulatory Visit: Payer: Self-pay | Admitting: Nephrology

## 2023-01-01 DIAGNOSIS — E785 Hyperlipidemia, unspecified: Secondary | ICD-10-CM

## 2023-01-01 DIAGNOSIS — N1831 Chronic kidney disease, stage 3a: Secondary | ICD-10-CM

## 2023-01-01 DIAGNOSIS — R8290 Unspecified abnormal findings in urine: Secondary | ICD-10-CM

## 2023-01-13 ENCOUNTER — Ambulatory Visit: Payer: BC Managed Care – PPO

## 2023-01-29 ENCOUNTER — Ambulatory Visit (INDEPENDENT_AMBULATORY_CARE_PROVIDER_SITE_OTHER): Payer: BC Managed Care – PPO

## 2023-01-29 ENCOUNTER — Encounter: Payer: Self-pay | Admitting: Emergency Medicine

## 2023-01-29 ENCOUNTER — Ambulatory Visit
Admission: EM | Admit: 2023-01-29 | Discharge: 2023-01-29 | Disposition: A | Discharge status: Home / Self Care | Payer: BC Managed Care – PPO | Attending: Family Medicine | Admitting: Family Medicine

## 2023-01-29 DIAGNOSIS — J989 Respiratory disorder, unspecified: Secondary | ICD-10-CM | POA: Diagnosis not present

## 2023-01-29 DIAGNOSIS — R42 Dizziness and giddiness: Secondary | ICD-10-CM

## 2023-01-29 LAB — COMPREHENSIVE METABOLIC PANEL
ALT: 42 U/L (ref 0–44)
AST: 16 U/L (ref 15–41)
Albumin: 4.1 g/dL (ref 3.5–5.0)
Alkaline Phosphatase: 67 U/L (ref 38–126)
Anion gap: 9 (ref 5–15)
BUN: 22 mg/dL (ref 8–23)
CO2: 25 mmol/L (ref 22–32)
Calcium: 9 mg/dL (ref 8.9–10.3)
Chloride: 97 mmol/L — ABNORMAL LOW (ref 98–111)
Creatinine, Ser: 1.24 mg/dL (ref 0.61–1.24)
GFR, Estimated: >60 mL/min (ref >60)
Glucose, Bld: 243 mg/dL — ABNORMAL HIGH (ref 70–99)
Potassium: 4.4 mmol/L (ref 3.5–5.1)
Sodium: 131 mmol/L — ABNORMAL LOW (ref 135–145)
Total Bilirubin: 0.9 mg/dL (ref 0.3–1.2)
Total Protein: 7.1 g/dL (ref 6.5–8.1)

## 2023-01-29 LAB — CBC WITH DIFFERENTIAL/PLATELET
Abs Immature Granulocytes: 0.02 10*3/uL (ref 0.00–0.07)
Basophils Absolute: 0 10*3/uL (ref 0.0–0.1)
Basophils Relative: 1 %
Eosinophils Absolute: 0.2 10*3/uL (ref 0.0–0.5)
Eosinophils Relative: 5 %
HCT: 41.3 % (ref 39.0–52.0)
Hemoglobin: 14.3 g/dL (ref 13.0–17.0)
Immature Granulocytes: 0 %
Lymphocytes Relative: 22 %
Lymphs Abs: 1 10*3/uL (ref 0.7–4.0)
MCH: 30.6 pg (ref 26.0–34.0)
MCHC: 34.6 g/dL (ref 30.0–36.0)
MCV: 88.4 fL (ref 80.0–100.0)
Monocytes Absolute: 0.3 10*3/uL (ref 0.1–1.0)
Monocytes Relative: 6 %
Neutro Abs: 3.1 10*3/uL (ref 1.7–7.7)
Neutrophils Relative %: 66 %
Platelets: 239 10*3/uL (ref 150–400)
RBC: 4.67 MIL/uL (ref 4.22–5.81)
RDW: 12.5 % (ref 11.5–15.5)
WBC: 4.7 10*3/uL (ref 4.0–10.5)
nRBC: 0 % (ref 0.0–0.2)

## 2023-01-29 MED ORDER — PREDNISONE 10 MG (21) PO TBPK: ORAL_TABLET | Freq: Every day | ORAL | 0 refills | Status: AC

## 2023-01-29 NOTE — Discharge Instructions (Addendum)
Your blood work and chest xray did not show cause of your symptoms. Continue taking the antibiotics prescribed.

## 2023-01-29 NOTE — ED Triage Notes (Signed)
Pt c/o cough, sore throat, bilateral ear pain and dizziness x 1 week. He contacted his PCP on 4/22 and was prescribed a Z-Pak and benzonatate. Pt states on 4/23 he became dizzy and hot and passed out at work.

## 2023-01-29 NOTE — ED Provider Notes (Signed)
[facility]    CSN: [id number] Arrival date & time: 01/29/23  0955      History   Chief Complaint Chief Complaint  Patient presents with   Cough   Sore Throat   Otalgia   Headache    HPI Travis Bishop is a 62 y.o. male.   HPI   Travis Bishop presents for cough, sore throat, ear pain and headache that started last week.  Over the weekend, his symptoms got worse. Tuesday, he passed out at work. He is taking cough meds, azithromycin that was prescribed by his PCP.  He doesn't feel like his symtoms are helping. When he gets up, he gets dizzy and sees stars. He has an intermittent "dry raspy cough."  He is a T1DM and blood sugar is 263.  He last ate around 8 AM.       Past Medical History:  Diagnosis Date   Anxiety    Arrhythmia    Depression    Diabetes (HCC)    GERD (gastroesophageal reflux disease)    Heart disease    Heart murmur    Sleep apnea     Patient Active Problem List   Diagnosis Date Noted   Hernia, abdominal 12/17/2016   Anxiety state 07/18/2016   Dyspepsia 07/18/2016   Personal history of other diseases of the musculoskeletal system and connective tissue 11/23/2015   Chromophytosis 11/23/2015   Feeling bilious 06/04/2015   Dry cough 06/04/2015   Type 1 diabetes mellitus without complication (HCC) 08/18/2014    Past Surgical History:  Procedure Laterality Date   LEFT HEART CATH AND CORONARY ANGIOGRAPHY Left 05/17/2020   Procedure: LEFT HEART CATH AND CORONARY ANGIOGRAPHY;  Surgeon: Marcina Millard, MD;  Location: [facility];  Service: Cardiovascular;  Laterality: Left;   none         Home Medications    Prior to Admission medications   Medication Sig Start Date End Date Taking? Authorizing Provider  predniSONE (STERAPRED UNI-PAK 21 TAB) 10 MG (21) TBPK tablet Take by mouth daily. Take 6 tabs by mouth daily for 1, then 5 tabs for 1 day, then 4 tabs for 1 day, then 3 tabs for 1 day, then 2 tabs for 1 day, then 1 tab for 1  day. 01/29/23  Yes Shweta Aman, DO  albuterol (VENTOLIN HFA) 108 (90 Base) MCG/ACT inhaler Inhale 2 puffs into the lungs every 6 (six) hours as needed for wheezing or shortness of breath.  06/04/15   [provider]  amoxicillin-clavulanate (AUGMENTIN) 875-125 MG tablet Take 1 tablet by mouth every 12 (twelve) hours. 12/21/21   Rodriguez-Southworth, Nettie Elm, PA-C  aspirin EC 81 MG tablet Take 81 mg by mouth daily. Swallow whole.    [provider]  aspirin-acetaminophen-caffeine (EXCEDRIN MIGRAINE) [phone] MG tablet Take 1 tablet by mouth daily as needed for headache or migraine.    [provider]  atorvastatin (LIPITOR) 20 MG tablet Take 40 mg by mouth at bedtime. 2 20mg  in AM    [provider]  benzonatate (TESSALON) 200 MG capsule Take 1 capsule (200 mg total) by mouth at bedtime as needed for cough. 12/21/21   Rodriguez-Southworth, Nettie Elm, PA-C  BIOTIN PO Take 1 tablet by mouth daily.    [provider]  busPIRone (BUSPAR) 15 MG tablet Take 7.5 mg by mouth daily.  10/03/15   [provider]  Continuous Blood Gluc Sensor (DEXCOM G6 SENSOR) MISC Use 1 each every 10 (ten) days 11/05/20   [provider]  CONTOUR NEXT TEST test strip USE 4 (FOUR) TIMES DAILY 03/08/19   [provider]  erythromycin ophthalmic ointment SMARTSIG:sparingly In Eye(s) Every Night 12/16/21   [provider]  glucagon 1 MG injection 1 mg IM in case of severe hypoglycemia 03/08/21   [provider]  LAGEVRIO 200 MG CAPS capsule Take by mouth. 10/03/21   [provider]  lisinopril (ZESTRIL) 10 MG tablet Take 1 tablet by mouth daily. 04/05/21   [provider]  NOVOLOG 100 UNIT/ML injection Inject 60 Units into the skin daily. Via insulin pump 11/01/15   [provider]  omeprazole (PRILOSEC) 20 MG capsule Take 20 mg by mouth See admin instructions. Take 20 mg daily, may take a second 20 mg dose as needed for acid  reflux 03/04/19   [provider]  promethazine-dextromethorphan (PROMETHAZINE-DM) 6.25-15 MG/5ML syrup Take 5 mLs by mouth 4 (four) times daily as needed. 10/17/21   Eusebio Friendly B, PA-C  sertraline (ZOLOFT) 50 MG tablet Take 1 tablet by mouth daily. 05/08/21   [provider]  tobramycin-dexamethasone Wallene Dales) ophthalmic solution 1 drop 4 (four) times daily. 12/16/21   [provider]    Family History Family History  Problem Relation Age of Onset   Bladder Cancer Father    Prostate cancer Neg Hx    Kidney cancer Neg Hx     Social History Social History   Tobacco Use   Smoking status: Never   Smokeless tobacco: Never  Vaping Use   Vaping Use: Never used  Substance Use Topics   Alcohol use: Yes    Alcohol/week: 0.0 standard drinks of alcohol    Comment: occsional   Drug use: No     Allergies   Patient has no known allergies.   Review of Systems Review of Systems: negative unless otherwise stated in HPI.      Physical Exam Triage Vital Signs ED Triage Vitals  Enc Vitals Group     BP 01/29/23 1006 130/78     Pulse Rate 01/29/23 1006 62     Resp 01/29/23 1006 16     Temp 01/29/23 1006 98.2 F (36.8 C)     Temp Source 01/29/23 1006 Oral     SpO2 01/29/23 1006 96 %     Weight --      Height --      Head Circumference --      Peak Flow --      Pain Score 01/29/23 1005 0     Pain Loc --      Pain Edu? --      Excl. in GC? --    No data found.  Updated Vital Signs BP 130/78 (BP Location: Right Arm)   Pulse 62   Temp 98.2 F (36.8 C) (Oral)   Resp 16   SpO2 96%   Visual Acuity Right Eye Distance:   Left Eye Distance:   Bilateral Distance:    Right Eye Near:   Left Eye Near:    Bilateral Near:     Physical Exam GEN:     alert, non-toxic appearing male in no distress    HENT:  mucus membranes moist, oropharyngeal without lesions or exudate, no tonsillar hypertrophy, mild oropharyngeal erythema, no nasal discharge,  EYES:    pupils equal and reactive, no scleral injection or discharge NECK:  normal ROM, no lymphadenopathy, no meningismus   RESP:  no increased work of breathing, clear to auscultation bilaterally CVS:  regular rate and rhythm Skin:   warm and dry    UC Treatments / Results  Labs (all labs ordered are listed, but only abnormal results are displayed) Labs Reviewed  COMPREHENSIVE METABOLIC PANEL - Abnormal; Notable for the following components:      Result Value   Sodium 131 (*)    Chloride 97 (*)    Glucose, Bld 243 (*)    All other components within normal limits  CBC WITH DIFFERENTIAL/PLATELET    EKG   Radiology DG Chest 2 View  Result Date: 01/29/2023 CLINICAL DATA:  Cough, sore throat, BILATERAL ear pain, dizziness EXAM: CHEST - 2 VIEW COMPARISON:  10/17/2021 FINDINGS: Normal heart size, mediastinal contours, and pulmonary vascularity. Lungs clear. No pleural effusion or pneumothorax. Bones unremarkable. IMPRESSION: Normal exam. Electronically Signed   By: Ulyses Southward M.D.   On: 01/29/2023 11:14     Procedures Procedures (including critical care time)  Medications Ordered in UC Medications - No data to display  Initial Impression / Assessment and Plan / UC Course  I have reviewed the triage vital signs and the nursing notes.  Pertinent labs & imaging results that were available during my care of the patient were reviewed by me and considered in my medical decision making (see chart for details).       Pt is a 62 y.o. male who presents for ongoing respiratory symptoms. Travis Bishop is afebrile here.  Satting 96% on room air. Overall pt is non-toxic appearing, well hydrated, without respiratory distress. Pulmonary exam is unremarkable.    On chart review, patient sent a medication request to his PCP, Dr. Graciela Husbands for antibiotics and something for his cough.  Dr. Graciela Husbands sent these medications as patient requested however he is not improving.  COVID deferred due to duration of symptoms.   Patient is already taking azithromycin.  Discussed obtaining a chest x-ray as he is not improving and he is agreeable. Chest xray personally reviewed by me without focal pneumonia, pleural effusion, cardiomegaly or pneumothorax.  Advised patient to continue taking azithromycin and we will start steroid taper to see if that provides him any additional relief.  If this does not resolve his symptoms, I suspect he is having a viral illness.  Given his resolved dizziness, CBC and CMP were obtained.  CBC was normal.  CMP showed hyperglycemia with pseudohyponatremia (glu 243, Na 131) is otherwise normal.  He is not in DKA nor does he have any liver, kidney or electrolyte derangements.  Etiology of his dizziness is not yet determined.   Return and ED precautions given and voiced understanding. Discussed MDM, treatment plan and plan for follow-up with patient who agrees with plan.     Final Clinical Impressions(s) / UC Diagnoses   Final diagnoses:  Respiratory illness  Dizziness     Discharge Instructions      Your blood work and chest xray did not show cause of your symptoms. Continue taking the antibiotics prescribed.       ED Prescriptions     Medication Sig Dispense Auth. Provider   predniSONE (STERAPRED UNI-PAK 21 TAB) 10 MG (21) TBPK tablet Take by mouth daily. Take 6 tabs by mouth daily for 1, then 5 tabs for 1 day, then 4 tabs for 1 day, then 3 tabs for 1 day, then 2 tabs for 1 day, then 1 tab for 1 day. 21 tablet Katha Cabal, DO      PDMP not reviewed this encounter.   Katha Cabal, DO 02/03/23 1952

## 2023-05-15 ENCOUNTER — Other Ambulatory Visit
Admission: RE | Admit: 2023-05-15 | Discharge: 2023-05-15 | Disposition: A | Discharge status: Home / Self Care | Payer: BC Managed Care – PPO | Source: Ambulatory Visit | Attending: Internal Medicine | Admitting: Internal Medicine

## 2023-05-15 DIAGNOSIS — Z01818 Encounter for other preprocedural examination: Secondary | ICD-10-CM | POA: Diagnosis not present

## 2023-05-15 LAB — BRAIN NATRIURETIC PEPTIDE: B Natriuretic Peptide: 56.4 pg/mL (ref 0.0–100.0)

## 2023-05-26 ENCOUNTER — Other Ambulatory Visit: Payer: Self-pay

## 2023-05-26 ENCOUNTER — Encounter
Admission: RE | Disposition: A | Discharge status: Home / Self Care | Payer: Self-pay | Source: Home / Self Care | Attending: Internal Medicine

## 2023-05-26 ENCOUNTER — Ambulatory Visit
Admission: RE | Admit: 2023-05-26 | Discharge: 2023-05-26 | Disposition: A | Discharge status: Home / Self Care | Payer: BC Managed Care – PPO | Attending: Internal Medicine | Admitting: Internal Medicine

## 2023-05-26 ENCOUNTER — Encounter: Payer: Self-pay | Admitting: Internal Medicine

## 2023-05-26 DIAGNOSIS — I2510 Atherosclerotic heart disease of native coronary artery without angina pectoris: Secondary | ICD-10-CM | POA: Diagnosis present

## 2023-05-26 HISTORY — PX: LEFT HEART CATH AND CORONARY ANGIOGRAPHY: CATH118249

## 2023-05-26 SURGERY — LEFT HEART CATH AND CORONARY ANGIOGRAPHY
Anesthesia: Moderate Sedation | Laterality: Left

## 2023-05-26 MED ORDER — HYDRALAZINE HCL 20 MG/ML IJ SOLN: 10.0000 mg | INTRAMUSCULAR | Status: DC | PRN

## 2023-05-26 MED ORDER — FENTANYL CITRATE (PF) 100 MCG/2ML IJ SOLN
INTRAMUSCULAR | Status: DC | PRN
  Administered 2023-05-26: 25 ug via INTRAVENOUS

## 2023-05-26 MED ORDER — MIDAZOLAM HCL 2 MG/2ML IJ SOLN
INTRAMUSCULAR | Status: AC
  Filled 2023-05-26: qty 2

## 2023-05-26 MED ORDER — SODIUM CHLORIDE 0.9 % WEIGHT BASED INFUSION
3.0000 mL/kg/h | INTRAVENOUS | Status: AC
  Administered 2023-05-26: 3 mL/kg/h via INTRAVENOUS

## 2023-05-26 MED ORDER — HEPARIN SODIUM (PORCINE) 1000 UNIT/ML IJ SOLN
INTRAMUSCULAR | Status: AC
  Filled 2023-05-26: qty 10

## 2023-05-26 MED ORDER — ASPIRIN 81 MG PO CHEW
CHEWABLE_TABLET | ORAL | Status: AC
  Filled 2023-05-26: qty 1

## 2023-05-26 MED ORDER — ACETAMINOPHEN 325 MG PO TABS: 650.0000 mg | ORAL_TABLET | ORAL | Status: DC | PRN

## 2023-05-26 MED ORDER — FENTANYL CITRATE (PF) 100 MCG/2ML IJ SOLN
INTRAMUSCULAR | Status: AC
  Filled 2023-05-26: qty 2

## 2023-05-26 MED ORDER — ONDANSETRON HCL 4 MG/2ML IJ SOLN: 4.0000 mg | Freq: Four times a day (QID) | INTRAMUSCULAR | Status: DC | PRN

## 2023-05-26 MED ORDER — IOHEXOL 300 MG/ML  SOLN
INTRAMUSCULAR | Status: DC | PRN
  Administered 2023-05-26: 56 mL

## 2023-05-26 MED ORDER — VERAPAMIL HCL 2.5 MG/ML IV SOLN
INTRAVENOUS | Status: DC | PRN
  Administered 2023-05-26: 2.5 mg via INTRA_ARTERIAL

## 2023-05-26 MED ORDER — LABETALOL HCL 5 MG/ML IV SOLN: 10.0000 mg | INTRAVENOUS | Status: DC | PRN

## 2023-05-26 MED ORDER — SODIUM CHLORIDE 0.9 % WEIGHT BASED INFUSION: 1.0000 mL/kg/h | INTRAVENOUS | Status: DC

## 2023-05-26 MED ORDER — SODIUM CHLORIDE 0.9 % IV SOLN: 250.0000 mL | INTRAVENOUS | Status: DC | PRN

## 2023-05-26 MED ORDER — SODIUM CHLORIDE 0.9% FLUSH: 3.0000 mL | Freq: Two times a day (BID) | INTRAVENOUS | Status: DC

## 2023-05-26 MED ORDER — HEPARIN (PORCINE) IN NACL 1000-0.9 UT/500ML-% IV SOLN
INTRAVENOUS | Status: DC | PRN
  Administered 2023-05-26 (×2): 500 mL

## 2023-05-26 MED ORDER — SODIUM CHLORIDE 0.9% FLUSH: 3.0000 mL | INTRAVENOUS | Status: DC | PRN

## 2023-05-26 MED ORDER — MIDAZOLAM HCL 2 MG/2ML IJ SOLN
INTRAMUSCULAR | Status: DC | PRN
  Administered 2023-05-26: 1 mg via INTRAVENOUS

## 2023-05-26 MED ORDER — LIDOCAINE HCL (PF) 1 % IJ SOLN
INTRAMUSCULAR | Status: DC | PRN
  Administered 2023-05-26: 2 mL

## 2023-05-26 MED ORDER — VERAPAMIL HCL 2.5 MG/ML IV SOLN
INTRAVENOUS | Status: AC
  Filled 2023-05-26: qty 2

## 2023-05-26 MED ORDER — ASPIRIN 81 MG PO CHEW
81.0000 mg | CHEWABLE_TABLET | ORAL | Status: AC
  Administered 2023-05-26: 81 mg via ORAL

## 2023-05-26 MED ORDER — HEPARIN SODIUM (PORCINE) 1000 UNIT/ML IJ SOLN
INTRAMUSCULAR | Status: DC | PRN
  Administered 2023-05-26: 4000 [IU] via INTRAVENOUS

## 2023-05-26 SURGICAL SUPPLY — 10 items
CATH 5FR JL3.5 JR4 ANG PIG MP (CATHETERS) IMPLANT
DEVICE RAD TR BAND REGULAR (VASCULAR PRODUCTS) IMPLANT
DRAPE BRACHIAL (DRAPES) IMPLANT
GLIDESHEATH SLEND SS 6F .021 (SHEATH) IMPLANT
GUIDEWIRE INQWIRE 1.5J.035X260 (WIRE) IMPLANT
INQWIRE 1.5J .035X260CM (WIRE) ×1
PACK CARDIAC CATH (CUSTOM PROCEDURE TRAY) ×1 IMPLANT
PROTECTION STATION PRESSURIZED (MISCELLANEOUS) ×1
SET ATX-X65L (MISCELLANEOUS) IMPLANT
STATION PROTECTION PRESSURIZED (MISCELLANEOUS) IMPLANT

## 2023-05-26 NOTE — Progress Notes (Addendum)
Patient wearing continuous glucose monitor and insulin pump for his type 1 diabetes upon arrival to [facility] for heart cath. Blood glucose 117 pre-procedure. Per patient preference, insulin pump removed by patient just before being loaded to cath lab for procedure, blood glucose at that time 86. Upon return from procedure, patient's blood glucose 128. Patient reconnected continuous glucose monitor to continue home management regimen upon return for recovery. Patient remained alert and oriented, significant other at bedside assisting with application of home monitor/pump.

## 2023-05-27 ENCOUNTER — Encounter: Payer: Self-pay | Admitting: Internal Medicine

## 2023-08-05 ENCOUNTER — Ambulatory Visit
Admission: EM | Admit: 2023-08-05 | Discharge: 2023-08-05 | Disposition: A | Discharge status: Home / Self Care | Payer: BC Managed Care – PPO | Attending: Physician Assistant | Admitting: Physician Assistant

## 2023-08-05 DIAGNOSIS — J029 Acute pharyngitis, unspecified: Secondary | ICD-10-CM | POA: Diagnosis not present

## 2023-08-05 LAB — GROUP A STREP BY PCR: Group A Strep by PCR: NOT DETECTED

## 2023-08-05 MED ORDER — LIDOCAINE VISCOUS HCL 2 % MT SOLN: 15.0000 mL | OROMUCOSAL | 0 refills | Status: AC | PRN

## 2023-08-05 NOTE — Discharge Instructions (Addendum)
URI/COLD SYMPTOMS: Your exam today is consistent with a viral illness. Antibiotics are not indicated at this time. Use medications as directed, including cough syrup, nasal saline, and decongestants. Your symptoms should improve over the next few days and resolve within 7-10 days. Try ibuprofen and chloraseptic spray. Increase rest and fluids. F/u if symptoms worsen or predominate such as sore throat, ear pain, productive cough, shortness of breath, or if you develop high fevers or worsening fatigue over the next several days.

## 2023-08-05 NOTE — ED Triage Notes (Signed)
Patient states that he's had a sore throat for 3 days with fatigue. Back of tongue is sore.

## 2023-08-05 NOTE — ED Provider Notes (Signed)
[facility]    CSN: [id number] Arrival date & time: 08/05/23  1318      History   Chief Complaint Chief Complaint  Patient presents with   Sore Throat    HPI Travis Bishop is a 62 y.o. male presenting for sore throat, bilateral ear pain, and fatigue x 3 days. Denies body aches, cough, congestion, fever, nasal congestion, chest pain, wheezing, shortness of breath, abdominal pain, nausea/vomiting or diarrhea.  Denies sick contacts. Has taken over the medications.  No other complaints or concerns.  HPI  Past Medical History:  Diagnosis Date   Anxiety    Arrhythmia    Depression    Diabetes (HCC)    GERD (gastroesophageal reflux disease)    Heart disease    Heart murmur    Sleep apnea     Patient Active Problem List   Diagnosis Date Noted   Hernia, abdominal 12/17/2016   Anxiety state 07/18/2016   Dyspepsia 07/18/2016   Personal history of other diseases of the musculoskeletal system and connective tissue 11/23/2015   Chromophytosis 11/23/2015   Feeling bilious 06/04/2015   Dry cough 06/04/2015   Type 1 diabetes mellitus without complication (HCC) 08/18/2014    Past Surgical History:  Procedure Laterality Date   LEFT HEART CATH AND CORONARY ANGIOGRAPHY Left 05/17/2020   Procedure: LEFT HEART CATH AND CORONARY ANGIOGRAPHY;  Surgeon: Marcina Millard, MD;  Location: [facility];  Service: Cardiovascular;  Laterality: Left;   LEFT HEART CATH AND CORONARY ANGIOGRAPHY Left 05/26/2023   Procedure: LEFT HEART CATH AND CORONARY ANGIOGRAPHY;  Surgeon: Alwyn Pea, MD;  Location: [facility];  Service: Cardiovascular;  Laterality: Left;   none         Home Medications    Prior to Admission medications   Medication Sig Start Date End Date Taking? Authorizing Provider  aspirin EC 81 MG tablet Take 81 mg by mouth daily. Swallow whole.   Yes [provider]  atorvastatin (LIPITOR) 20 MG tablet Take 40 mg by mouth at  bedtime. 2 20mg  in AM   Yes [provider]  BIOTIN PO Take 1 tablet by mouth daily.   Yes [provider]  busPIRone (BUSPAR) 15 MG tablet Take 7.5 mg by mouth daily.  10/03/15  Yes [provider]  lidocaine (XYLOCAINE) 2 % solution Use as directed 15 mLs in the mouth or throat every 3 (three) hours as needed for mouth pain (swish and spit). 08/05/23  Yes Eusebio Friendly B, PA-C  lisinopril (ZESTRIL) 10 MG tablet Take 1 tablet by mouth daily. 04/05/21  Yes [provider]  NOVOLOG 100 UNIT/ML injection Inject 60 Units into the skin daily. Via insulin pump 11/01/15  Yes [provider]  rosuvastatin (CRESTOR) 40 MG tablet Take 40 mg by mouth daily.   Yes [provider]  sertraline (ZOLOFT) 50 MG tablet Take 1 tablet by mouth daily. 05/08/21  Yes [provider]  albuterol (VENTOLIN HFA) 108 (90 Base) MCG/ACT inhaler Inhale 2 puffs into the lungs every 6 (six) hours as needed for wheezing or shortness of breath.  06/04/15   [provider]  amoxicillin-clavulanate (AUGMENTIN) 875-125 MG tablet Take 1 tablet by mouth every 12 (twelve) hours. 12/21/21   Rodriguez-Southworth, Nettie Elm, PA-C  aspirin-acetaminophen-caffeine (EXCEDRIN MIGRAINE) [phone] MG tablet Take 1 tablet by mouth daily as needed for headache or migraine.    [provider]  benzonatate (TESSALON) 200 MG capsule Take 1 capsule (200 mg total)  by mouth at bedtime as needed for cough. 12/21/21   Rodriguez-Southworth, Nettie Elm, PA-C  Continuous Blood Gluc Sensor (DEXCOM G6 SENSOR) MISC Use 1 each every 10 (ten) days 11/05/20   [provider]  CONTOUR NEXT TEST test strip USE 4 (FOUR) TIMES DAILY 03/08/19   [provider]  erythromycin ophthalmic ointment SMARTSIG:sparingly In Eye(s) Every Night 12/16/21   [provider]  glucagon 1 MG injection 1 mg IM in case of severe hypoglycemia 03/08/21   [provider]  LAGEVRIO 200 MG CAPS  capsule Take by mouth. 10/03/21   [provider]  omeprazole (PRILOSEC) 20 MG capsule Take 20 mg by mouth See admin instructions. Take 20 mg daily, may take a second 20 mg dose as needed for acid reflux 03/04/19   [provider]  predniSONE (STERAPRED UNI-PAK 21 TAB) 10 MG (21) TBPK tablet Take by mouth daily. Take 6 tabs by mouth daily for 1, then 5 tabs for 1 day, then 4 tabs for 1 day, then 3 tabs for 1 day, then 2 tabs for 1 day, then 1 tab for 1 day. Patient not taking: Reported on 05/26/2023 01/29/23   Katha Cabal, DO  promethazine-dextromethorphan (PROMETHAZINE-DM) 6.25-15 MG/5ML syrup Take 5 mLs by mouth 4 (four) times daily as needed. Patient not taking: Reported on 05/26/2023 10/17/21   Eusebio Friendly B, PA-C  tobramycin-dexamethasone [facility]) ophthalmic solution 1 drop 4 (four) times daily. Patient not taking: Reported on 05/26/2023 12/16/21   [provider]    Family History Family History  Problem Relation Age of Onset   Bladder Cancer Father    Prostate cancer Neg Hx    Kidney cancer Neg Hx     Social History Social History   Tobacco Use   Smoking status: Never   Smokeless tobacco: Never   Tobacco comments:    Occasional cigar smoking in the past, quit in 2023  Vaping Use   Vaping status: Never Used  Substance Use Topics   Alcohol use: Yes    Alcohol/week: 0.0 standard drinks of alcohol    Comment: occsional   Drug use: No     Allergies   Patient has no known allergies.   Review of Systems Review of Systems  Constitutional:  Positive for fatigue. Negative for fever.  HENT:  Positive for ear pain and sore throat. Negative for congestion, rhinorrhea, sinus pressure and sinus pain.   Respiratory:  Negative for cough and shortness of breath.   Cardiovascular:  Negative for chest pain.  Gastrointestinal:  Negative for abdominal pain, diarrhea, nausea and vomiting.  Musculoskeletal:  Negative for myalgias.  Neurological:  Negative  for weakness, light-headedness and headaches.  Hematological:  Negative for adenopathy.     Physical Exam Triage Vital Signs ED Triage Vitals  Encounter Vitals Group     BP      Systolic BP Percentile      Diastolic BP Percentile      Pulse      Resp      Temp      Temp src      SpO2      Weight      Height      Head Circumference      Peak Flow      Pain Score      Pain Loc      Pain Education      Exclude from Growth Chart    No data found.  Updated Vital Signs BP 133/73 (  BP Location: Right Arm)   Pulse 62   Temp 97.9 F (36.6 C) (Oral)   Resp 18   SpO2 100%    Physical Exam Vitals and nursing note reviewed.  Constitutional:      General: He is not in acute distress.    Appearance: Normal appearance. He is well-developed. He is not ill-appearing.  HENT:     Head: Normocephalic and atraumatic.     Right Ear: Tympanic membrane and ear canal normal.     Left Ear: Tympanic membrane and ear canal normal.     Nose: Nose normal. No congestion.     Mouth/Throat:     Mouth: Mucous membranes are moist.     Pharynx: Posterior oropharyngeal erythema present.  Eyes:     General: No scleral icterus.    Conjunctiva/sclera: Conjunctivae normal.  Cardiovascular:     Rate and Rhythm: Normal rate and regular rhythm.     Heart sounds: Normal heart sounds.  Pulmonary:     Effort: Pulmonary effort is normal. No respiratory distress.     Breath sounds: Normal breath sounds.  Musculoskeletal:     Cervical back: Neck supple.  Skin:    General: Skin is warm and dry.     Capillary Refill: Capillary refill takes less than 2 seconds.  Neurological:     General: No focal deficit present.     Mental Status: He is alert. Mental status is at baseline.     Motor: No weakness.     Gait: Gait normal.  Psychiatric:        Mood and Affect: Mood normal.        Behavior: Behavior normal.      UC Treatments / Results  Labs (all labs ordered are listed, but only abnormal results  are displayed) Labs Reviewed  GROUP A STREP BY PCR    EKG   Radiology No results found.  Procedures Procedures (including critical care time)  Medications Ordered in UC Medications - No data to display  Initial Impression / Assessment and Plan / UC Course  I have reviewed the triage vital signs and the nursing notes.  Pertinent labs & imaging results that were available during my care of the patient were reviewed by me and considered in my medical decision making (see chart for details).   62 y/o male presents for sore throat, bilateral ear pain, and fatigue x 3 days. No fever, cough, congestion.   Vitals are normal and stable and he is overall well appearing. Has mild posterior pharyngeal erythema. No ear infection. Chest is CTA.  PCR strep obtained.  Negative.  Reviewed result with patient.  Offered COVID test but he declined stating he is not concerned about this.  Viral pharyngitis.  Supportive care encouraged with increasing rest and fluids.  Reviewed use of viscous lidocaine, ibuprofen/Tylenol, Chloraseptic spray.  Reviewed return precautions.   Final Clinical Impressions(s) / UC Diagnoses   Final diagnoses:  Viral pharyngitis     Discharge Instructions      URI/COLD SYMPTOMS: Your exam today is consistent with a viral illness. Antibiotics are not indicated at this time. Use medications as directed, including cough syrup, nasal saline, and decongestants. Your symptoms should improve over the next few days and resolve within 7-10 days. Try ibuprofen and chloraseptic spray. Increase rest and fluids. F/u if symptoms worsen or predominate such as sore throat, ear pain, productive cough, shortness of breath, or if you develop high fevers or worsening fatigue over the next several  days.       ED Prescriptions     Medication Sig Dispense Auth. Provider   lidocaine (XYLOCAINE) 2 % solution Use as directed 15 mLs in the mouth or throat every 3 (three) hours as needed  for mouth pain (swish and spit). 100 mL Shirlee Latch, PA-C      PDMP not reviewed this encounter.   Shirlee Latch, PA-C 08/05/23 [phone]
